# Patient Record
Sex: Female | Born: 1985 | Race: Black or African American | Hispanic: No | Marital: Single | Smoking: Former smoker
Health system: Southern US, Community
[De-identification: ages and names within clinical notes are randomized; demographics above are authoritative.]

## PROBLEM LIST (undated history)

## (undated) DIAGNOSIS — A749 Chlamydial infection, unspecified: Secondary | ICD-10-CM

## (undated) DIAGNOSIS — A599 Trichomoniasis, unspecified: Secondary | ICD-10-CM

## (undated) HISTORY — PX: NO PAST SURGERIES: SHX2092

---
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
## 2006-01-29 ENCOUNTER — Ambulatory Visit: Payer: Self-pay | Admitting: Sports Medicine

## 2006-02-06 ENCOUNTER — Ambulatory Visit: Payer: Self-pay | Admitting: Family Medicine

## 2006-02-14 ENCOUNTER — Ambulatory Visit: Payer: Self-pay | Admitting: Sports Medicine

## 2006-05-02 ENCOUNTER — Ambulatory Visit: Payer: Self-pay | Admitting: Family Medicine

## 2006-06-17 ENCOUNTER — Ambulatory Visit: Payer: Self-pay | Admitting: Sports Medicine

## 2006-07-18 ENCOUNTER — Ambulatory Visit: Payer: Self-pay | Admitting: Family Medicine

## 2006-07-18 DIAGNOSIS — K219 Gastro-esophageal reflux disease without esophagitis: Secondary | ICD-10-CM

## 2006-07-18 DIAGNOSIS — G43909 Migraine, unspecified, not intractable, without status migrainosus: Secondary | ICD-10-CM | POA: Insufficient documentation

## 2006-10-04 ENCOUNTER — Ambulatory Visit: Payer: Self-pay | Admitting: Family Medicine

## 2006-12-20 ENCOUNTER — Ambulatory Visit: Payer: Self-pay | Admitting: Family Medicine

## 2007-01-29 ENCOUNTER — Telehealth: Payer: Self-pay | Admitting: *Deleted

## 2007-01-30 ENCOUNTER — Encounter: Payer: Self-pay | Admitting: Family Medicine

## 2007-01-30 ENCOUNTER — Ambulatory Visit: Payer: Self-pay | Admitting: Family Medicine

## 2007-04-05 ENCOUNTER — Emergency Department (HOSPITAL_COMMUNITY): Admission: EM | Admit: 2007-04-05 | Discharge: 2007-04-05 | Payer: Self-pay | Admitting: Emergency Medicine

## 2007-05-03 ENCOUNTER — Emergency Department (HOSPITAL_COMMUNITY): Admission: EM | Admit: 2007-05-03 | Discharge: 2007-05-03 | Payer: Self-pay | Admitting: Emergency Medicine

## 2007-09-03 ENCOUNTER — Inpatient Hospital Stay (HOSPITAL_COMMUNITY): Admission: AD | Admit: 2007-09-03 | Discharge: 2007-09-03 | Payer: Self-pay | Admitting: Obstetrics and Gynecology

## 2007-09-15 ENCOUNTER — Observation Stay (HOSPITAL_COMMUNITY): Admission: AD | Admit: 2007-09-15 | Discharge: 2007-09-16 | Payer: Self-pay | Admitting: Obstetrics & Gynecology

## 2007-09-15 ENCOUNTER — Ambulatory Visit: Payer: Self-pay | Admitting: Obstetrics and Gynecology

## 2007-11-10 ENCOUNTER — Ambulatory Visit (HOSPITAL_COMMUNITY): Admission: RE | Admit: 2007-11-10 | Discharge: 2007-11-10 | Payer: Self-pay | Admitting: Family Medicine

## 2007-11-28 ENCOUNTER — Inpatient Hospital Stay (HOSPITAL_COMMUNITY): Admission: AD | Admit: 2007-11-28 | Discharge: 2007-11-28 | Payer: Self-pay | Admitting: Obstetrics & Gynecology

## 2007-11-28 ENCOUNTER — Ambulatory Visit: Payer: Self-pay | Admitting: Advanced Practice Midwife

## 2008-03-13 ENCOUNTER — Inpatient Hospital Stay (HOSPITAL_COMMUNITY): Admission: AD | Admit: 2008-03-13 | Discharge: 2008-03-13 | Payer: Self-pay | Admitting: Obstetrics & Gynecology

## 2008-03-25 ENCOUNTER — Ambulatory Visit: Payer: Self-pay | Admitting: Obstetrics & Gynecology

## 2008-03-25 ENCOUNTER — Inpatient Hospital Stay (HOSPITAL_COMMUNITY): Admission: AD | Admit: 2008-03-25 | Discharge: 2008-03-28 | Payer: Self-pay | Admitting: Obstetrics & Gynecology

## 2008-12-24 ENCOUNTER — Emergency Department (HOSPITAL_COMMUNITY): Admission: EM | Admit: 2008-12-24 | Discharge: 2008-12-24 | Payer: Self-pay | Admitting: Emergency Medicine

## 2009-05-23 ENCOUNTER — Encounter (INDEPENDENT_AMBULATORY_CARE_PROVIDER_SITE_OTHER): Payer: Self-pay | Admitting: *Deleted

## 2009-05-23 DIAGNOSIS — F17200 Nicotine dependence, unspecified, uncomplicated: Secondary | ICD-10-CM

## 2010-04-17 ENCOUNTER — Emergency Department (HOSPITAL_COMMUNITY): Admission: EM | Admit: 2010-04-17 | Discharge: 2010-04-17 | Payer: Self-pay | Admitting: Family Medicine

## 2010-05-01 ENCOUNTER — Emergency Department (HOSPITAL_COMMUNITY)
Admission: EM | Admit: 2010-05-01 | Discharge: 2010-05-01 | Payer: Self-pay | Source: Home / Self Care | Admitting: Emergency Medicine

## 2010-06-22 NOTE — Miscellaneous (Signed)
Summary: Tobacco Marcello Tuzzolino  Clinical Lists Changes  Problems: Added new problem of TOBACCO Kiaraliz Rafuse (ICD-305.1) 

## 2010-08-23 ENCOUNTER — Inpatient Hospital Stay (HOSPITAL_COMMUNITY)
Admission: AD | Admit: 2010-08-23 | Discharge: 2010-08-23 | Disposition: A | Discharge status: Home / Self Care | Payer: Medicaid Other | Source: Ambulatory Visit | Attending: Obstetrics & Gynecology | Admitting: Obstetrics & Gynecology

## 2010-08-23 DIAGNOSIS — N760 Acute vaginitis: Secondary | ICD-10-CM

## 2010-08-23 DIAGNOSIS — N926 Irregular menstruation, unspecified: Secondary | ICD-10-CM

## 2010-08-23 DIAGNOSIS — N939 Abnormal uterine and vaginal bleeding, unspecified: Secondary | ICD-10-CM

## 2010-08-23 DIAGNOSIS — N938 Other specified abnormal uterine and vaginal bleeding: Secondary | ICD-10-CM | POA: Insufficient documentation

## 2010-08-23 DIAGNOSIS — N72 Inflammatory disease of cervix uteri: Secondary | ICD-10-CM | POA: Insufficient documentation

## 2010-08-23 DIAGNOSIS — A499 Bacterial infection, unspecified: Secondary | ICD-10-CM | POA: Insufficient documentation

## 2010-08-23 DIAGNOSIS — B9689 Other specified bacterial agents as the cause of diseases classified elsewhere: Secondary | ICD-10-CM | POA: Insufficient documentation

## 2010-08-23 DIAGNOSIS — N949 Unspecified condition associated with female genital organs and menstrual cycle: Secondary | ICD-10-CM | POA: Insufficient documentation

## 2010-08-23 LAB — URINE MICROSCOPIC-ADD ON

## 2010-08-23 LAB — WET PREP, GENITAL
Trich, Wet Prep: NONE SEEN
Yeast Wet Prep HPF POC: NONE SEEN

## 2010-08-23 LAB — SAMPLE TO BLOOD BANK

## 2010-08-23 LAB — URINALYSIS, ROUTINE W REFLEX MICROSCOPIC
Ketones, ur: >80 mg/dL — AB
Leukocytes, UA: NEGATIVE
Specific Gravity, Urine: >1.03 — ABNORMAL HIGH (ref 1.005–1.030)
Urobilinogen, UA: 1 mg/dL (ref 0.0–1.0)
pH: 6 (ref 5.0–8.0)

## 2010-08-23 LAB — POCT PREGNANCY, URINE: Preg Test, Ur: NEGATIVE

## 2010-08-24 LAB — GC/CHLAMYDIA PROBE AMP, GENITAL: GC Probe Amp, Genital: NEGATIVE

## 2010-08-26 LAB — STREP A DNA PROBE

## 2010-08-26 LAB — RAPID STREP SCREEN (MED CTR MEBANE ONLY): Streptococcus, Group A Screen (Direct): NEGATIVE

## 2010-10-03 NOTE — Discharge Summary (Signed)
NAMENATONYA, Tammie Webster                ACCOUNT NO.:  [id number]   MEDICAL RECORD NO.:  [id number]          PATIENT TYPE:  OBV   LOCATION:  9302                          FACILITY:  [facility]   PHYSICIAN:  Lesly Dukes, M.D. DATE OF BIRTH:  [birth year]   DATE OF ADMISSION:  09/15/2007  DATE OF DISCHARGE:  09/16/2007                               DISCHARGE SUMMARY   HOSPITAL COURSE:  The patient is a 25 year old G1, P0 with hyperemesis  and hypokalemia.  Upon arrival to maternity admissions unit, the patient  was admitted for IV fluids and potassium runs.  The patient also  received antiemetics and did very well.  She has had no nausea or  vomiting for over 12 hours, and desires to go home.  The patient also  had 3 infections.  The patient had positive Chlamydia and was treated  with IV azithromycin.  The patient also has Trichomonas but we will hold  off treating her with Flagyl until her nausea and vomiting are well  controlled.  She also has a urinary tract infection and we will treat  her with Keflex for 7 days.  There is a urine culture pending.   DISCHARGE LABS:  Potassium on September 15, 2007, equals 3.4.   DISCHARGE INSTRUCTIONS:  [facility]. [facility]      will treat her for the Trichomonas.  2. Take all medications.   DISCHARGE MEDICATIONS:  1. Prenatal vitamins 1 tab p.o. daily.  2. Phenergan 25 mg p.o. q.6 h. p.r.n. nausea.  3. Keflex 500 mg 1 tab p.o. q.i.d. for 1 week.      Lesly Dukes, M.D.  Electronically Signed     [facility]/MEDQ  D:  09/16/2007  T:  09/16/2007  Job:  [id number]

## 2011-02-13 LAB — URINALYSIS, ROUTINE W REFLEX MICROSCOPIC
Bilirubin Urine: NEGATIVE
Glucose, UA: 100 — AB
Hgb urine dipstick: NEGATIVE
Hgb urine dipstick: NEGATIVE
Nitrite: POSITIVE — AB
Specific Gravity, Urine: 1.015
pH: 7

## 2011-02-13 LAB — POCT PREGNANCY, URINE: Preg Test, Ur: POSITIVE

## 2011-02-13 LAB — COMPREHENSIVE METABOLIC PANEL
ALT: 31
AST: 24
Albumin: 3.4 — ABNORMAL LOW
Calcium: 8.8
Creatinine, Ser: 0.79
GFR calc Af Amer: >60
Sodium: 129 — ABNORMAL LOW

## 2011-02-13 LAB — CBC
HCT: 38.1
HCT: 38.3
Hemoglobin: 13.4
MCHC: 35.6
Platelets: 274
RBC: 4.38
RDW: 12.5
WBC: 5.7
WBC: 6.6

## 2011-02-13 LAB — URINE CULTURE

## 2011-02-13 LAB — ABO/RH: ABO/RH(D): B POS

## 2011-02-13 LAB — POTASSIUM: Potassium: 3.4 — ABNORMAL LOW

## 2011-02-13 LAB — URINE MICROSCOPIC-ADD ON

## 2011-02-13 LAB — GC/CHLAMYDIA PROBE AMP, GENITAL
Chlamydia, DNA Probe: POSITIVE — AB
GC Probe Amp, Genital: NEGATIVE

## 2011-02-13 LAB — WET PREP, GENITAL

## 2011-02-15 LAB — URINALYSIS, ROUTINE W REFLEX MICROSCOPIC
Bilirubin Urine: NEGATIVE
Glucose, UA: NEGATIVE
Nitrite: NEGATIVE
Specific Gravity, Urine: 1.02
pH: 7.5

## 2011-02-15 LAB — URINE MICROSCOPIC-ADD ON

## 2011-02-19 LAB — URINALYSIS, ROUTINE W REFLEX MICROSCOPIC
Hgb urine dipstick: NEGATIVE
Protein, ur: 30 — AB
Urobilinogen, UA: 2 — ABNORMAL HIGH

## 2011-02-19 LAB — URINE CULTURE: Colony Count: >=100000

## 2011-02-19 LAB — URINE MICROSCOPIC-ADD ON

## 2011-02-20 LAB — CBC
Platelets: 365
RDW: 13.5

## 2011-02-27 LAB — POCT PREGNANCY, URINE
Operator id: 151321
Preg Test, Ur: NEGATIVE

## 2013-02-26 ENCOUNTER — Emergency Department (HOSPITAL_COMMUNITY)
Admission: EM | Admit: 2013-02-26 | Discharge: 2013-02-26 | Disposition: A | Discharge status: Home / Self Care | Payer: Medicaid Other | Attending: Emergency Medicine | Admitting: Emergency Medicine

## 2013-02-26 ENCOUNTER — Encounter (HOSPITAL_COMMUNITY): Payer: Self-pay | Admitting: Emergency Medicine

## 2013-02-26 DIAGNOSIS — L0291 Cutaneous abscess, unspecified: Secondary | ICD-10-CM

## 2013-02-26 DIAGNOSIS — L0231 Cutaneous abscess of buttock: Secondary | ICD-10-CM | POA: Insufficient documentation

## 2013-02-26 DIAGNOSIS — F17200 Nicotine dependence, unspecified, uncomplicated: Secondary | ICD-10-CM | POA: Insufficient documentation

## 2013-02-26 NOTE — ED Notes (Signed)
Pt has abscess to right upper inner buttocks x 2 weeks. Denies fever or chills. Minimal amount of drainage.

## 2013-02-26 NOTE — ED Provider Notes (Signed)
CSN: [id number]     Arrival date & time 02/26/13  0815 History   First MD Initiated Contact with Patient 02/26/13 [phone]     Chief Complaint  Patient presents with  . Abscess   (Consider location/radiation/quality/duration/timing/severity/associated sxs/prior Treatment) Patient is a 27 y.o. female presenting with abscess.  Abscess Location:  Ano-genital Ano-genital abscess location:  R buttock and gluteal cleft Size:  3cm Abscess quality: fluctuance, induration and painful   Abscess quality: no redness   Red streaking: no   Duration:  2 weeks Progression:  Worsening Pain details:    Quality:  Sharp   Severity:  Severe   Timing:  Constant   Progression:  Worsening Chronicity:  New Context: not diabetes and not immunosuppression   Relieved by:  Nothing Ineffective treatments:  Warm compresses and warm water soaks Associated symptoms: no fever, no headaches and no nausea   Associated symptoms comment:  No pain with BM.     History reviewed. No pertinent past medical history. History reviewed. No pertinent past surgical history. No family history on file. History  Substance Use Topics  . Smoking status: Current Some Day Smoker  . Smokeless tobacco: Not on file  . Alcohol Use: No   OB History   Grav Para Term Preterm Abortions TAB SAB Ect Mult Living                 Review of Systems  Constitutional: Negative for fever.  Gastrointestinal: Negative for nausea.  Neurological: Negative for headaches.  All other systems reviewed and are negative.    Allergies  Shellfish allergy  Home Medications  No current outpatient prescriptions on file. BP 116/78  Pulse 68  Temp(Src) 98.2 F (36.8 C) (Oral)  Resp 16  Ht 5\' 4"  (1.626 m)  Wt 129 lb (58.514 kg)  BMI 22.13 kg/m2  SpO2 100% Physical Exam  Nursing note and vitals reviewed. Constitutional: She is oriented to person, place, and time. She appears well-developed and well-nourished. No distress.  HENT:  Head:  Normocephalic and atraumatic.  Eyes: Conjunctivae are normal. No scleral icterus.  Neck: Neck supple.  Cardiovascular: Normal rate and intact distal pulses.   Pulmonary/Chest: Effort normal. No stridor. No respiratory distress.  Abdominal: Normal appearance. She exhibits no distension.  Neurological: She is alert and oriented to person, place, and time.  Skin: Skin is warm and dry. No rash noted.  Large indurated, fluctuant mass on right buttock just lateral to gluteal crease.  TTP.  Not draining.    Psychiatric: She has a normal mood and affect. Her behavior is normal.    ED Course  INCISION AND DRAINAGE Date/Time: 02/26/2013 9:43 AM Performed by: Blake Divine DAVID Authorized by: Blake Divine DAVID Consent: Verbal consent obtained. Risks and benefits: risks, benefits and alternatives were discussed Consent given by: patient Patient identity confirmed: verbally with patient Type: abscess Body area: anogenital (Right buttock at gluteal cleft) Local anesthetic: lidocaine 2% with epinephrine Anesthetic total: 6 ml Scalpel size: 11 Incision type: single straight Complexity: simple Drainage: purulent Drainage amount: copious Wound treatment: wound left open Packing material: 1/4 in iodoform gauze Patient tolerance: Patient tolerated the procedure well with no immediate complications.   (including critical care time) Labs Review Labs Reviewed - No data to display Imaging Review No results found.  MDM  No diagnosis found. 27 year old female with a right buttock abscess. Drained a large amount of purulent fluid.      Candyce Churn, MD 02/26/13 [phone]

## 2013-02-28 ENCOUNTER — Emergency Department (HOSPITAL_COMMUNITY)
Admission: EM | Admit: 2013-02-28 | Discharge: 2013-02-28 | Disposition: A | Discharge status: Home / Self Care | Payer: Medicaid Other | Attending: Emergency Medicine | Admitting: Emergency Medicine

## 2013-02-28 ENCOUNTER — Encounter (HOSPITAL_COMMUNITY): Payer: Self-pay | Admitting: Emergency Medicine

## 2013-02-28 DIAGNOSIS — F17200 Nicotine dependence, unspecified, uncomplicated: Secondary | ICD-10-CM | POA: Insufficient documentation

## 2013-02-28 DIAGNOSIS — Z5189 Encounter for other specified aftercare: Secondary | ICD-10-CM

## 2013-02-28 DIAGNOSIS — Z4801 Encounter for change or removal of surgical wound dressing: Secondary | ICD-10-CM | POA: Insufficient documentation

## 2013-02-28 NOTE — ED Notes (Signed)
Seen in ED with abscess and had I&D with packing.  Here for wound check.  Denies pain and states has been draining intermittently light pink yellow.

## 2013-02-28 NOTE — ED Provider Notes (Signed)
CSN: [id number]     Arrival date & time 02/28/13  1108 History  This chart was scribed for non-physician practitioner, Arthor Captain, working with No att. providers found by Clydene Laming, ED Scribe. This patient was seen in room TR09C/TR09C and the patient's care was started at 11:22 AM.   Chief Complaint  Patient presents with  . Wound Check    The history is provided by the patient. No language interpreter was used.   HPI Comments: Tammie Webster is a 27 y.o. female who presents to the Emergency Department requesting a wound check from an abscess which she was seen and treated for two days ago. Abscess was drained and packed. She states she has a doctor appointment on October 15 th. Pt denies feelings of emesis.  No past medical history on file. No past surgical history on file. No family history on file. History  Substance Use Topics  . Smoking status: Current Some Day Smoker  . Smokeless tobacco: Not on file  . Alcohol Use: No   OB History   Grav Para Term Preterm Abortions TAB SAB Ect Mult Living                 Review of Systems  Constitutional: Negative for fever and chills.  Gastrointestinal: Negative for vomiting.  Skin: Positive for wound (Abscess).    Allergies  Shellfish allergy  Home Medications  No current outpatient prescriptions on file. Triage Vitals:BP 116/78  Pulse 94  Temp(Src) 97.5 F (36.4 C) (Oral)  Resp 16  SpO2 100% Physical Exam  Nursing note and vitals reviewed. Constitutional: She is oriented to person, place, and time. She appears well-developed and well-nourished. No distress.  HENT:  Head: Normocephalic and atraumatic.  Eyes: EOM are normal.  Neck: Neck supple. No tracheal deviation present.  Cardiovascular: Normal rate.   Pulmonary/Chest: Effort normal. No respiratory distress.  Musculoskeletal: Normal range of motion.  Neurological: She is alert and oriented to person, place, and time.  Skin: Skin is warm and dry.  Well healing  abscess statues post ind 3 cm surround induration  Redness appears to be resolving Discharge noticed with packing removal   Psychiatric: She has a normal mood and affect. Her behavior is normal.    ED Course  Procedures (including critical care time) DIAGNOSTIC STUDIES: Oxygen Saturation is 100% on RA, normal by my interpretation.    COORDINATION OF CARE: 11:32 AM- Discussed treatment plan with pt at bedside. Pt verbalized understanding and agreement with plan.   Labs Review Labs Reviewed - No data to display Imaging Review No results found.  EKG Interpretation   None       MDM   1. Wound check, abscess    Pt abscess packing appeals to be healing well. Pt will be discharged with safety precautions.  I personally performed the services described in this documentation, which was scribed in my presence. The recorded information has been reviewed and is accurate.      Arthor Captain, PA-C 02/28/13 1751

## 2013-03-01 NOTE — ED Provider Notes (Signed)
Medical screening examination/treatment/procedure(s) were performed by non-physician practitioner and as supervising physician I was immediately available for consultation/collaboration.   Carmine Youngberg, MD 03/01/13 0932 

## 2013-06-14 ENCOUNTER — Encounter (HOSPITAL_COMMUNITY): Payer: Self-pay | Admitting: Emergency Medicine

## 2013-06-14 ENCOUNTER — Emergency Department (HOSPITAL_COMMUNITY)
Admission: EM | Admit: 2013-06-14 | Discharge: 2013-06-14 | Disposition: A | Discharge status: Home / Self Care | Payer: Medicaid Other | Source: Home / Self Care

## 2013-06-14 DIAGNOSIS — I10 Essential (primary) hypertension: Secondary | ICD-10-CM

## 2013-06-14 DIAGNOSIS — T887XXA Unspecified adverse effect of drug or medicament, initial encounter: Secondary | ICD-10-CM

## 2013-06-14 DIAGNOSIS — R42 Dizziness and giddiness: Secondary | ICD-10-CM

## 2013-06-14 HISTORY — DX: Chlamydial infection, unspecified: A74.9

## 2013-06-14 HISTORY — DX: Trichomoniasis, unspecified: A59.9

## 2013-06-14 LAB — POCT URINALYSIS DIP (DEVICE)
BILIRUBIN URINE: NEGATIVE
GLUCOSE, UA: NEGATIVE mg/dL
HGB URINE DIPSTICK: NEGATIVE
Leukocytes, UA: NEGATIVE
NITRITE: NEGATIVE
PH: 6 (ref 5.0–8.0)
Protein, ur: NEGATIVE mg/dL
Urobilinogen, UA: 1 mg/dL (ref 0.0–1.0)

## 2013-06-14 LAB — POCT PREGNANCY, URINE: Preg Test, Ur: NEGATIVE

## 2013-06-14 NOTE — ED Notes (Signed)
States went to health dept for routine exam and deposhot - was told 1/22 that she tested positive for trich and chlamydia; completed abx treatment for both.  C/O dizziness and low abd pain "side effects" ever since starting first dose.  States vaginal discharge now gone.  Denies fevers.  Denies vomiting; nausea has resolved (after 1st dose).  Admits she does not have adequate PO fluid intake.

## 2013-06-14 NOTE — Discharge Instructions (Signed)
Dizziness Dizziness is a common problem. It is a feeling of unsteadiness or lightheadedness. You may feel like you are about to faint. Dizziness can lead to injury if you stumble or fall. A person of any age group can suffer from dizziness, but dizziness is more common in older adults. CAUSES  Dizziness can be caused by many different things, including:  Middle ear problems.  Standing for too long.  Infections.  An allergic reaction.  Aging.  An emotional response to something, such as the sight of blood.  Side effects of medicines.  Fatigue.  Problems with circulation or blood pressure.  Excess use of alcohol, medicines, or illegal drug use.  Breathing too fast (hyperventilation).  An arrhythmia or problems with your heart rhythm.  Low red blood cell count (anemia).  Pregnancy.  Vomiting, diarrhea, fever, or other illnesses that cause dehydration.  Diseases or conditions such as Parkinson's disease, high blood pressure (hypertension), diabetes, and thyroid problems.  Exposure to extreme heat. DIAGNOSIS  To find the cause of your dizziness, your caregiver may do a physical exam, lab tests, radiologic imaging scans, or an electrocardiography test (ECG).  TREATMENT  Treatment of dizziness depends on the cause of your symptoms and can vary greatly. HOME CARE INSTRUCTIONS   Drink enough fluids to keep your urine clear or pale yellow. This is especially important in very hot weather. In the elderly, it is also important in cold weather.  If your dizziness is caused by medicines, take them exactly as directed. When taking blood pressure medicines, it is especially important to get up slowly.  Rise slowly from chairs and steady yourself until you feel okay.  In the morning, first sit up on the side of the bed. When this seems okay, stand slowly while holding onto something until you know your balance is fine.  If you need to stand in one place for a long time, be sure to  move your legs often. Tighten and relax the muscles in your legs while standing.  If dizziness continues to be a problem, have someone stay with you for a day or two. Do this until you feel you are well enough to stay alone. Have the person call your caregiver if he or she notices changes in you that are concerning.  Do not drive or use heavy machinery if you feel dizzy.  Do not drink alcohol. SEEK IMMEDIATE MEDICAL CARE IF:   Your dizziness or lightheadedness gets worse.  You feel nauseous or vomit.  You develop problems with talking, walking, weakness, or using your arms, hands, or legs.  You are not thinking clearly or you have difficulty forming sentences. It may take a friend or family member to determine if your thinking is normal.  You develop chest pain, abdominal pain, shortness of breath, or sweating.  Your vision changes.  You notice any bleeding.  You have side effects from medicine that seems to be getting worse rather than better. MAKE SURE YOU:   Understand these instructions.  Will watch your condition.  Will get help right away if you are not doing well or get worse. Document Released: 10/31/2000 Document Revised: 07/30/2011 Document Reviewed: 11/24/2010 [facility] Patient Information 2014 [place], [place].  Near-Syncope Near-syncope (commonly known as near fainting) is sudden weakness, dizziness, or feeling like you might pass out. This can happen when getting up or while standing for a long time. It is caused by a sudden decrease in blood flow to the brain, which can occur for various  reasons. Most of the reasons are not serious.  HOME CARE Watch your condition for any changes.  Have someone stay with you until you feel stable.  If you feel like you are going to pass out:  Lie down right away.  Breathe deeply and steadily.  Move only when the feeling has gone away. Most of the time, this feeling lasts only a few minutes. You may feel tired for several  hours.  Drink enough fluids to keep your pee (urine) clear or pale yellow.  If you are taking blood pressure or heart medicine, stand up slowly.  Follow up with your doctor as told. GET HELP RIGHT AWAY IF:   You have a severe headache.  You have unusual pain in the chest, belly (abdomen), or back.  You have bleeding from the mouth or butt (rectum), or you have black or tarry poop (stool).  You feel your heart beat differently than normal, or you have a very fast pulse.  You pass out, or you twitch and shake when you pass out.  You pass out when sitting or lying down.  You feel confused.  You have trouble walking.  You are weak.  You have vision problems. MAKE SURE YOU:   Understand these instructions.  Will watch your condition.  Will get help right away if you are not doing well or get worse. Document Released: 10/24/2007 Document Revised: 01/07/2013 Document Reviewed: 10/10/2012 [facility] Patient Information 2014 [place].

## 2013-06-14 NOTE — ED Provider Notes (Signed)
CSN: [id number]     Arrival date & time 06/14/13  1540 History   First MD Initiated Contact with Patient 06/14/13 1721     Chief Complaint  Patient presents with  . Dizziness  . Abdominal Pain   (Consider location/radiation/quality/duration/timing/severity/associated sxs/prior Treatment) HPI Comments: After taking her medications today for STD's she developed dizziness and discomfort in abdomen. No actual pain. No nausea. St has bee afraid of her reaction to the meds and almost passed out. Sx's are subsiding now.   Past Medical History  Diagnosis Date  . Trichomonas infection   . Chlamydia    History reviewed. No pertinent past surgical history. No family history on file. History  Substance Use Topics  . Smoking status: Current Some Day Smoker  . Smokeless tobacco: Not on file  . Alcohol Use: No   OB History   Grav Para Term Preterm Abortions TAB SAB Ect Mult Living                 Review of Systems  Constitutional: Positive for activity change and appetite change. Negative for fever.  HENT: Negative.   Respiratory: Negative.   Cardiovascular: Negative.   Gastrointestinal: Negative for nausea, vomiting, abdominal pain and abdominal distention.  Skin: Negative.   Neurological: Positive for dizziness.    Allergies  Shellfish allergy  Home Medications   Current Outpatient Rx  Name  Route  Sig  Dispense  Refill  . medroxyPROGESTERone (DEPO-PROVERA) 150 MG/ML injection   Intramuscular   Inject 150 mg into the muscle every 3 (three) months.          BP 115/80  Pulse 128  Temp(Src) 98.8 F (37.1 C) (Oral)  Resp 19  SpO2 97%  LMP 05/30/2013 Physical Exam  Nursing note and vitals reviewed. Constitutional: She is oriented to person, place, and time. She appears well-developed and well-nourished. No distress.  Eyes: Conjunctivae and EOM are normal.  Neck: Normal range of motion. Neck supple.  Cardiovascular: Normal rate, regular rhythm and normal heart sounds.    Pulmonary/Chest: Effort normal and breath sounds normal. No respiratory distress. She has no wheezes. She has no rales.  Abdominal: Soft. Bowel sounds are normal. She exhibits no distension. There is no tenderness. There is no rebound.  Neurological: She is alert and oriented to person, place, and time. She exhibits normal muscle tone.  Skin: Skin is warm and dry.  Psychiatric: She has a normal mood and affect.    ED Course  Procedures (including critical care time) Labs Review Labs Reviewed  POCT URINALYSIS DIP (DEVICE) - Abnormal; Notable for the following:    Ketones, ur TRACE (*)    All other components within normal limits  POCT PREGNANCY, URINE   Imaging Review No results found.  Results for orders placed during the hospital encounter of 06/14/13  POCT URINALYSIS DIP (DEVICE)      Result Value Range   Glucose, UA NEGATIVE  NEGATIVE mg/dL   Bilirubin Urine NEGATIVE  NEGATIVE   Ketones, ur TRACE (*) NEGATIVE mg/dL   Specific Gravity, Urine >=1.030  1.005 - 1.030   Hgb urine dipstick NEGATIVE  NEGATIVE   pH 6.0  5.0 - 8.0   Protein, ur NEGATIVE  NEGATIVE mg/dL   Urobilinogen, UA 1.0  0.0 - 1.0 mg/dL   Nitrite NEGATIVE  NEGATIVE   Leukocytes, UA NEGATIVE  NEGATIVE  POCT PREGNANCY, URINE      Result Value Range   Preg Test, Ur NEGATIVE  NEGATIVE  MDM   1. Drug side effects   2. Dizziness      Reassurance, drink plenty of fluids and stay well hydrated. OOW for today  Hayden Rasmussen, NP 06/14/13 1742  Hayden Rasmussen, NP 06/14/13 [phone]

## 2013-06-15 NOTE — ED Provider Notes (Signed)
Medical screening examination/treatment/procedure(s) were performed by resident physician or non-physician practitioner and as supervising physician I was immediately available for consultation/collaboration.   Nohelia Valenza DOUGLAS MD.   Jacquis Paxton D Ezell Poke, MD 06/15/13 1434 

## 2013-06-27 ENCOUNTER — Encounter (HOSPITAL_COMMUNITY): Payer: Self-pay | Admitting: Emergency Medicine

## 2013-06-27 ENCOUNTER — Emergency Department (HOSPITAL_COMMUNITY)
Admission: EM | Admit: 2013-06-27 | Discharge: 2013-06-27 | Disposition: A | Discharge status: Home / Self Care | Payer: Medicaid Other | Source: Home / Self Care | Attending: Family Medicine | Admitting: Family Medicine

## 2013-06-27 DIAGNOSIS — J111 Influenza due to unidentified influenza virus with other respiratory manifestations: Secondary | ICD-10-CM

## 2013-06-27 MED ORDER — OSELTAMIVIR PHOSPHATE 75 MG PO CAPS: 75.0000 mg | ORAL_CAPSULE | Freq: Two times a day (BID) | ORAL | Status: DC

## 2013-06-27 MED ORDER — ACETAMINOPHEN 325 MG PO TABS
650.0000 mg | ORAL_TABLET | Freq: Once | ORAL | Status: AC
  Administered 2013-06-27: 650 mg via ORAL

## 2013-06-27 MED ORDER — ACETAMINOPHEN 325 MG PO TABS
ORAL_TABLET | ORAL | Status: AC
  Filled 2013-06-27: qty 2

## 2013-06-27 NOTE — Discharge Instructions (Signed)
Thank you for coming in today. Take Tamiflu twice daily for 5 days Take Tylenol, Aleve, or ibuprofen for pain fevers and chills and body aches. Return to work when feeling better. Call or go to the emergency room if you get worse, have trouble breathing, have chest pains, or palpitations.   Influenza A (H1N1) H1N1 formerly called "swine flu" is a new influenza virus causing sickness in people. The H1N1 virus is different from seasonal influenza viruses. However, the H1N1 symptoms are similar to seasonal influenza and it is spread from person to person. You may be at higher risk for serious problems if you have underlying serious medical conditions. The CDC and the [employer] CompanyWorld Health Organization are following reported cases around the world. CAUSES   The flu is thought to spread mainly person-to-person through coughing or sneezing of infected people.  A person may become infected by touching something with the virus on it and then touching their mouth or nose. SYMPTOMS   Fever.  Headache.  Tiredness.  Cough.  Sore throat.  Runny or stuffy nose.  Body aches.  Diarrhea and vomiting These symptoms are referred to as "flu-like symptoms." A lot of different illnesses, including the common cold, may have similar symptoms. DIAGNOSIS   There are tests that can tell if you have the H1N1 virus.  Confirmed cases of H1N1 will be reported to the state or local health department.  A doctor's exam may be needed to tell whether you have an infection that is a complication of the flu. HOME CARE INSTRUCTIONS   Stay informed. Visit the [facility] website for current recommendations. Visit [employer]tnwww.cdc.gov/H1N1flu/. You may also call 1-800-CDC-INFO (636-824-92711-434-714-8758).  Get help early if you develop any of the above symptoms.  If you are at high risk from complications of the flu, talk to your caregiver as soon as you develop flu-like symptoms. Those at higher risk for complications include:  People 65 years or  older.  People with chronic medical conditions.  Pregnant women.  Young children.  Your caregiver may recommend antiviral medicine to help treat the flu.  If you get the flu, get plenty of rest, drink enough water and fluids to keep your urine clear or pale yellow, and avoid using alcohol or tobacco.  You may take over-the-counter medicine to relieve the symptoms of the flu if your caregiver approves. (Never give aspirin to children or teenagers who have flu-like symptoms, particularly fever). TREATMENT  If you do get sick, antiviral drugs are available. These drugs can make your illness milder and make you feel better faster. Treatment should start soon after illness starts. It is only effective if taken within the first day of becoming ill. Only your caregiver can prescribe antiviral medication.  PREVENTION   Cover your nose and mouth with a tissue or your arm when you cough or sneeze. Throw the tissue away.  Wash your hands often with soap and warm water, especially after you cough or sneeze. Alcohol-based cleaners are also effective against germs.  Avoid touching your eyes, nose or mouth. This is one way germs spread.  Try to avoid contact with sick people. Follow public health advice regarding school closures. Avoid crowds.  Stay home if you get sick. Limit contact with others to keep from infecting them. People infected with the H1N1 virus may be able to infect others anywhere from 1 day before feeling sick to 5-7 days after getting flu symptoms.  An H1N1 vaccine is available to help protect against the virus. In  addition to the H1N1 vaccine, you will need to be vaccinated for seasonal influenza. The H1N1 and seasonal vaccines may be given on the same day. The CDC especially recommends the H1N1 vaccine for:  Pregnant women.  People who live with or care for children younger than 22 months of age.  Health care and emergency services personnel.  Persons between the ages of 52  months through 61 years of age.  People from ages 77 through 56 years who are at higher risk for H1N1 because of chronic health disorders or immune system problems. FACEMASKS In community and home settings, the use of facemasks and N95 respirators are not normally recommended. In certain circumstances, a facemask or N95 respirator may be used for persons at increased risk of severe illness from influenza. Your caregiver can give additional recommendations for facemask use. IN CHILDREN, EMERGENCY WARNING SIGNS THAT NEED URGENT MEDICAL CARE:  Fast breathing or trouble breathing.  Bluish skin color.  Not drinking enough fluids.  Not waking up or not interacting normally.  Being so fussy that the child does not want to be held.  Your child has an oral temperature above 102 F (38.9 C), not controlled by medicine.  Your baby is older than 3 months with a rectal temperature of 102 F (38.9 C) or higher.  Your baby is 37 months old or younger with a rectal temperature of 100.4 F (38 C) or higher.  Flu-like symptoms improve but then return with fever and worse cough. IN ADULTS, EMERGENCY WARNING SIGNS THAT NEED URGENT MEDICAL CARE:  Difficulty breathing or shortness of breath.  Pain or pressure in the chest or abdomen.  Sudden dizziness.  Confusion.  Severe or persistent vomiting.  Bluish color.  You have a oral temperature above 102 F (38.9 C), not controlled by medicine.  Flu-like symptoms improve but return with fever and worse cough. SEEK IMMEDIATE MEDICAL CARE IF:  You or someone you know is experiencing any of the above symptoms. When you arrive at the emergency center, report that you think you have the flu. You may be asked to wear a mask and/or sit in a secluded area to protect others from getting sick. MAKE SURE YOU:   Understand these instructions.  Will watch your condition.  Will get help right away if you are not doing well or get worse. Some of this  information courtesy of the CDC.  Document Released: 10/24/2007 Document Revised: 07/30/2011 Document Reviewed: 10/24/2007 [facility] Patient Information 2014 [place], [place].

## 2013-06-27 NOTE — ED Provider Notes (Signed)
Tammie Webster is a 28 y.o. female who presents to Urgent Care today for fever body ache headache and chills. No nausea vomiting diarrhea or shortness of breath. Patient's son was recently diagnosed with influenza. The symptoms started today and her severe. She has not tried any medications yet.   Past Medical History  Diagnosis Date  . Trichomonas infection   . Chlamydia    History  Substance Use Topics  . Smoking status: Current Some Day Smoker  . Smokeless tobacco: Not on file  . Alcohol Use: No   ROS as above Medications: No current facility-administered medications for this encounter.   Current Outpatient Prescriptions  Medication Sig Dispense Refill  . medroxyPROGESTERone (DEPO-PROVERA) 150 MG/ML injection Inject 150 mg into the muscle every 3 (three) months.      Marland Kitchen. oseltamivir (TAMIFLU) 75 MG capsule Take 1 capsule (75 mg total) by mouth every 12 (twelve) hours.  10 capsule  0    Exam:  BP 114/73  Pulse 117  Temp(Src) 103.1 F (39.5 C) (Oral)  Resp 18  SpO2 98%  LMP 06/27/2013 Gen: Well NAD nontoxic HEENT: EOMI,  MMM Lungs: Normal work of breathing. CTABL Heart: Tachycardia but regular no MRG Abd: NABS, Soft. NT, ND Exts: Brisk capillary refill, warm and well perfused.   Assessment and Plan: 28 y.o. female with influenza-like illness. Plan for treatment with Tamiflu and NSAIDs and Tylenol. Followup as needed.  Discussed warning signs or symptoms. Please see discharge instructions. Patient expresses understanding.    Rodolph BongEvan S Jacen Carlini, MD 06/27/13 [phone]

## 2013-06-27 NOTE — ED Notes (Signed)
C/o cold/flu like sxs since yest Sxs include fevers, dizziness, chest pain due to dry cough, chills Denies n/v/d, SOB, wheezing Had ibup at 1430 and taking hot tea Alert w/no signs of acute distress.

## 2014-04-08 ENCOUNTER — Emergency Department (HOSPITAL_COMMUNITY)
Admission: EM | Admit: 2014-04-08 | Discharge: 2014-04-08 | Disposition: A | Discharge status: Home / Self Care | Payer: Medicaid Other | Attending: Emergency Medicine | Admitting: Emergency Medicine

## 2014-04-08 ENCOUNTER — Encounter (HOSPITAL_COMMUNITY): Payer: Self-pay | Admitting: Emergency Medicine

## 2014-04-08 DIAGNOSIS — Z79899 Other long term (current) drug therapy: Secondary | ICD-10-CM | POA: Diagnosis not present

## 2014-04-08 DIAGNOSIS — L0291 Cutaneous abscess, unspecified: Secondary | ICD-10-CM

## 2014-04-08 DIAGNOSIS — Z8619 Personal history of other infectious and parasitic diseases: Secondary | ICD-10-CM | POA: Diagnosis not present

## 2014-04-08 DIAGNOSIS — Z720 Tobacco use: Secondary | ICD-10-CM | POA: Insufficient documentation

## 2014-04-08 DIAGNOSIS — L0231 Cutaneous abscess of buttock: Secondary | ICD-10-CM | POA: Diagnosis not present

## 2014-04-08 MED ORDER — HYDROCODONE-ACETAMINOPHEN 5-325 MG PO TABS: ORAL_TABLET | ORAL | Status: DC

## 2014-04-08 MED ORDER — SULFAMETHOXAZOLE-TRIMETHOPRIM 800-160 MG PO TABS: 1.0000 | ORAL_TABLET | Freq: Two times a day (BID) | ORAL | Status: DC

## 2014-04-08 MED ORDER — LIDOCAINE-EPINEPHRINE (PF) 2 %-1:200000 IJ SOLN
10.0000 mL | Freq: Once | INTRAMUSCULAR | Status: AC
  Administered 2014-04-08: 10 mL via INTRADERMAL
  Filled 2014-04-08: qty 20

## 2014-04-08 MED ORDER — LIDOCAINE-EPINEPHRINE (PF) 1 %-1:200000 IJ SOLN: 20.0000 mL | Freq: Once | INTRAMUSCULAR | Status: DC

## 2014-04-08 NOTE — ED Notes (Signed)
Patient states has had abscess on R buttock.   Patient states she has been taking epsom salt baths, and been taking percocet for pain.   Patient states she had one in the same place a few months ago.   Patient states "it's ready to be cut open".

## 2014-04-08 NOTE — Discharge Instructions (Signed)
Wash gently with soap and water every 12 hours, rinse the affected area with warm water thoroughly every 12 hours. Push the skin around the area to make sure all the water is pushed out after you are through rinsing.   If you develop fever, have vomiting or if the swelling and redness starts spreading , return to the emergency room immediately for a recheck.  Take vicodin for breakthrough pain, do not drink alcohol, drive, care for children or do other critical tasks while taking vicodin.  Do not hesitate to return to the emergency room for any new, worsening or concerning symptoms.  Please obtain primary care using resource guide below. But the minute you were seen in the emergency room and that they will need to obtain records for further outpatient management.   Abscess Care After An abscess (also called a boil or furuncle) is an infected area that contains a collection of pus. Signs and symptoms of an abscess include pain, tenderness, redness, or hardness, or you may feel a moveable soft area under your skin. An abscess can occur anywhere in the body. The infection may spread to surrounding tissues causing cellulitis. A cut (incision) by the surgeon was made over your abscess and the pus was drained out. Gauze may have been packed into the space to provide a drain that will allow the cavity to heal from the inside outwards. The boil may be painful for 5 to 7 days. Most people with a boil do not have high fevers. Your abscess, if seen early, may not have localized, and may not have been lanced. If not, another appointment may be required for this if it does not get better on its own or with medications. HOME CARE INSTRUCTIONS   Only take over-the-counter or prescription medicines for pain, discomfort, or fever as directed by your caregiver.  When you bathe, soak and then remove gauze or iodoform packs at least daily or as directed by your caregiver. You may then wash the wound gently with mild  soapy water. Repack with gauze or do as your caregiver directs. SEEK IMMEDIATE MEDICAL CARE IF:   You develop increased pain, swelling, redness, drainage, or bleeding in the wound site.  You develop signs of generalized infection including muscle aches, chills, fever, or a general ill feeling.  An oral temperature above 102 F (38.9 C) develops, not controlled by medication. See your caregiver for a recheck if you develop any of the symptoms described above. If medications (antibiotics) were prescribed, take them as directed. Document Released: 11/23/2004 Document Revised: 07/30/2011 Document Reviewed: 07/21/2007 [facility] Patient Information 2015 [place], [place]. This information is not intended to replace advice given to you by your health care provider. Make sure you discuss any questions you have with your health care provider.    Emergency Department Resource Guide 1) Find a Doctor and Pay Out of Pocket Although you won't have to find out who is covered by your insurance plan, it is a good idea to ask around and get recommendations. You will then need to call the office and see if the doctor you have chosen will accept you as a new patient and what types of options they offer for patients who are self-pay. Some doctors offer discounts or will set up payment plans for their patients who do not have insurance, but you will need to ask so you aren't surprised when you get to your appointment.  2) Contact Your Local Health Department Not all health departments have doctors  that can see patients for sick visits, but many do, so it is worth a call to see if yours does. If you don't know where your local health department is, you can check in your phone book. The CDC also has a tool to help you locate your state's health department, and many state websites also have listings of all of their local health departments.  3) Find a Walk-in Clinic If your illness is not likely to be very severe or  complicated, you may want to try a walk in clinic. These are popping up all over the country in pharmacies, drugstores, and shopping centers. They're usually staffed by nurse practitioners or physician assistants that have been trained to treat common illnesses and complaints. They're usually fairly quick and inexpensive. However, if you have serious medical issues or chronic medical problems, these are probably not your best option.  No Primary Care Doctor: - Call Health Connect at  [phone] - they can help you locate a primary care doctor that  accepts your insurance, provides certain services, etc. - Physician Referral Service- [phone]  Chronic Pain Problems: Organization         Address  Phone   Notes  [facility]  [phone] Patients need to be referred by their primary care doctor.   Medication Assistance: Organization         Address  Phone   Notes  [facility] Medication [facility] [address] [place], [place] [address] [phone] --Must be a resident of [facility] [place] -- Must have NO insurance coverage whatsoever (no Medicaid/ Medicare, etc.) -- The pt. MUST have a primary care doctor that directs their care regularly and follows them in the community   MedAssist  [phone]   [employer]  [phone]    Agencies that provide inexpensive medical care: Organization         Address  Phone   Notes  [facility]  [phone]   [facility]    [phone]) [phone]   [facility] [address] [place], [place] [address] [phone]) [phone]   [facility] [address], [place] [phone]) [phone]   Planned Parenthood    [phone](336) [phone]   [facility]    [phone]   [facility]  [address], [place] Phone:  [phone], Fax:  [phone] Hours of Operation:  9 am - 6 pm, M-F.  Also accepts  Medicaid/Medicare and self-pay.  [facility]  [address], [place] Phone: [phone]) [phone], Fax: [phone]. Hours of Operation:  8:30 am - 5:30 pm, M-F.  Also accepts Medicaid and self-pay.  [facility] [place] [address], [place] Phone: [phone](336) [phone]   [facility] [address], [place], [place] [phone], Ext. [phone] Mondays & Thursdays: 7-9 AM.  First 15 patients are seen on a first come, first serve basis.    Medicaid-accepting [facility] Providers:  Organization         Address  Phone   Notes  [facility] [address], [place] [phone]) [phone] Also accepts self-pay patients.  [facility] [address], [place]  [phone]) [phone]   [facility] [address], [place] [phone]   [facility] [address], [place] [phone]   Renaye Rakers [address], [place]   [phone] Only accepts [place] patients after they have their name applied to their card.   Self-Pay (no insurance) in [facility]:  Organization         Address  Phone   Notes  Sickle Cell Patients, [facility] [address] [place], [place] [phone]   [facility] [address] [place], [place] [phone]   [facility] [place]  [address], [place] [phone]   [facility]/Dr. Osei-Bonsu  [address] [place] or [address], [place] [phone] Phone number for both [place] and [place] locations is the same.  Urgent Medical and [facility] [address], [place] [phone]   [facility] [address], [place] or [address] [phone]   [facility] [address] [place] [phone], phone; [phone], fax Sees patients 1st and 3rd Saturday of every month.  Must not qualify for public or private insurance (i.e. Medicaid, Medicare, [place] Health Choice, Veterans' Benefits)  Household income should be no more than 200% of the poverty level The clinic cannot treat you if you are pregnant or think you are pregnant  Sexually transmitted diseases are not treated at the clinic.    Dental Care: Organization         Address  Phone  Notes  [facility] of [facility] [address] [place], [place] [phone] Accepts children up to age 15 who are enrolled in [place] or [place] Health Choice; pregnant women with a Medicaid card; and children who have applied for Medicaid or [place] Health Choice, but were declined, whose parents can pay a reduced fee at time of service.  [facility] Department of [facility]  [address], [place] [phone] Accepts children up to age 60 who are enrolled in [place] or [place] Health Choice; pregnant women with a Medicaid card; and children who have applied for Medicaid or [place] Health Choice, but were declined, whose parents can pay a reduced fee at time of service.  [facility]  [address] [place], [place] [phone] Patients are seen by appointment only. Walk-ins are not accepted. [facility] will see patients 77 years of age and older. Monday - Tuesday (8am-5pm) Most Wednesdays (8:30-5pm) $30 per visit, cash only  [facility] Access PRO[facility]  [address], [facility] [phone] Patients are seen by appointment only. Walk-ins are not accepted. [facility] will see patients 24 years of age and older. One Wednesday Evening (Monthly: Volunteer Based).  $30 per visit, cash only  [employer]  [phone] for adults; Children under age 54, call Graduate Pediatric Dentistry at [phone]. Children aged  79-14, please call [phone] to request a pediatric application.  Dental services are provided in all areas of dental care including fillings, crowns and bridges, complete and partial dentures, implants, gum treatment, root canals, and extractions. Preventive care is also provided. Treatment is provided to both adults and children. Patients are selected via a lottery and there is often a waiting list.   [facility] [address], [place]  [phone] [email or web address]   [facility] [address], [place], [place] [phone], Ext. [phone] Second and Fourth Thursday of each month, opens at 6:30 AM; Clinic ends at 9 AM.  Patients are seen on a first-come first-served basis, and a limited number are seen during each clinic.   [facility]  [address], [place], [place] [phone]) [phone]   Eligibility Requirements You must have lived in [place], [place], or [place] counties for at least the last three months.   You cannot be eligible for state or federal sponsored National Cityhealthcare insurance, including CIGNAVeterans Administration, [place]icaid, or [employer]Medicare.   You generally cannot be eligible for healthcare insurance through your employer.    How to apply: Eligibility screenings are held every Tuesday and Wednesday afternoon from 1:00 pm until 4:00 pm. You do not need an appointment for the interview!  [facility] [address], [place], [place] [phone]   [facility]  [phone]   [facility]  [phone]   [facility]  [phone]    Behavioral Health Resources in the Community: Intensive Outpatient Programs Organization         Address  Phone  Notes  [facility] [address], [place], [place] [phone]   [facility] [address], [place], [place] [phone]   [facility] [address],  [place], [place]  [phone]   [facility] [address],  [place], [place] [phone] or [phone]   Substance Abuse Resources Organization         Address  Phone  Notes  [facility] Drug Services  [phone]   [facility]  [phone]   [facility]  [phone]   [facility]  [phone]   Residential & Outpatient Substance Abuse Program  [phone]   Psychological Services Organization         Address  Phone  Notes  [facility]  [phone]- [phone]   [facility]  [phone]- [phone]   [facility] [address], [place] [phone] or [phone]    Mobile Crisis Teams Organization         Address  Phone  Notes  [facility], Mobile Crisis Care Unit  [phone]   [facility]  [address] [place], [place] [phone]   Doristine LocksSharon DeEsch [address] [place] [phone]    Self-Help/Support Groups Organization         Address  Phone             Notes  [facility] [place] - variety of support groups  [phone] Call for more information  Narcotics Anonymous (NA), [facility]aring [facility] [address], [place]  2 meetings at this location   Statisticianesidential Treatment Programs Organization         Address  Phone  Notes  [facility] [address],    [place]  [phone]   [facility]  [address], [place] [phone], [place], [place] [phone]   [facility] [address], [place] [phone] Admissions: 8am-3pm M-F  [facility] [address]    [place], [place] [phone]   [facility] [address], [place], [place] [phone]   [facility] [address]  [place], [place] [phone]   [facility] - Intensive Outpatient [address], [place], [place] [phone]  [facility]  ([facility]iction [facility] [address]  [place], [place] [phone] or [phone]   [facility] ([facility]) [address] [place], [place] [phone] Accepts Medicaid  [facility] [address]  [place] [phone] Substance Abuse/Addiction Treatment   [facility] Resources Organization         Address  Phone  Notes  [facility]  [phone]) [phone]   Angie FavaJulie Brannon, PhD [address], [address] [place], [place]   [phone]) [phone] or [phone]   [facility]   [address] [place], [place] [phone]   [facility] [address], [place], [place] [phone](336) [phone] Insurance/Medicaid/sponsorship through [facility]  [facility] [address]                                    [place], [place] [phone]) [phone] Therapy/tele-psych/case  [facility] [address].   [place], [place] [phone]) [phone]    Dr. Lolly MustacheArfeen  [phone]) [phone]   [facility] [place]  [employer] [facility] 1) [address], [place] 2) [address], [place] 3)  [address], [place] [phone]) [phone]  [phone](336) [phone]   [facility] Child Abuse Hotline [phone]) [phone] or [phone] (After Hours)

## 2014-04-08 NOTE — ED Provider Notes (Signed)
CSN: [id number]     Arrival date & time 04/08/14  0945 History  This chart was scribed for non-physician practitioner, Wynetta EmeryNicole Rey Fors, PA-C, working with Tilden FossaElizabeth Rees, MD, by Ronney LionSuzanne Le, ED Scribe. This patient was seen in room TR11C/TR11C and the patient's care was started at 11:14 AM.    Chief Complaint  Patient presents with  . Abscess   The history is provided by the patient. No language interpreter was used.    HPI Comments: Tammie Webster is a 28 y.o. female who presents to the Emergency Department complaining of a right buttock abscess that began a week ago. She has had an abscess before in that same location a few months prior. She has tried bath and salts that haven't worked. She denies fever and chills, nausea, vomiting, history of diabetes, history of long-term steroid use.  Past Medical History  Diagnosis Date  . Trichomonas infection   . Chlamydia    History reviewed. No pertinent past surgical history. No family history on file. History  Substance Use Topics  . Smoking status: Current Every Day Smoker -- 0.10 packs/day    Types: Cigarettes  . Smokeless tobacco: Not on file  . Alcohol Use: No   OB History    No data available     Review of Systems  All other systems reviewed and are negative.  A complete 10 system review of systems was obtained and all systems are negative except as noted in the HPI and PMH.    Allergies  Shellfish allergy  Home Medications   Prior to Admission medications   Medication Sig Start Date End Date Taking? Authorizing Provider  medroxyPROGESTERone (DEPO-PROVERA) 150 MG/ML injection Inject 150 mg into the muscle every 3 (three) months.    Historical Provider, MD  oseltamivir (TAMIFLU) 75 MG capsule Take 1 capsule (75 mg total) by mouth every 12 (twelve) hours. 06/27/13   Rodolph BongEvan S Corey, MD   BP 112/73 mmHg  Pulse 100  Temp(Src) 98.3 F (36.8 C) (Oral)  Resp 19  Ht 5\' 3"  (1.6 m)  Wt 126 lb (57.153 kg)  BMI 22.33 kg/m2  SpO2  100% Physical Exam  Constitutional: She is oriented to person, place, and time. She appears well-developed and well-nourished. No distress.  HENT:  Head: Normocephalic.  Eyes: Conjunctivae and EOM are normal.  Cardiovascular: Normal rate.   Pulmonary/Chest: Effort normal. No stridor.  Musculoskeletal: Normal range of motion.  Neurological: She is alert and oriented to person, place, and time.  Skin:     Psychiatric: She has a normal mood and affect.  Nursing note and vitals reviewed.   ED Course  Procedures (including critical care time)  DIAGNOSTIC STUDIES: Oxygen Saturation is 100% on RA, normal by my interpretation.    COORDINATION OF CARE: 11:17 AM - Discussed treatment plan with pt at bedside which includes incision and drainage and pt agreed to plan.   INCISION AND DRAINAGE PROCEDURE NOTE: Patient identification was confirmed and verbal consent was obtained. This procedure was performed by Wynetta EmeryNicole Lake Breeding, PA-C, working with Tilden FossaElizabeth Rees, MD at 11:46 AM. Site: right gluteus Sterile procedures observed Needle size: no needle Anesthetic used (type and amt): 1% Lidocaine with Epi, 5 mL  Blade size: 11 Drainage: copious, purulent Complexity: Complex Packing used: none Site anesthetized, incision made over site, wound drained and explored loculations, rinsed with copious amounts of normal saline, wound packed with sterile gauze, covered with dry, sterile dressing.  Pt tolerated procedure well without complications.  Instructions for  care discussed verbally and pt provided with additional written instructions for homecare and f/u.  Labs Review Labs Reviewed - No data to display  Imaging Review No results found.   EKG Interpretation None      MDM   Final diagnoses:  None    Filed Vitals:   04/08/14 1015 04/08/14 1213  BP: 112/73 105/70  Pulse: 100 76  Temp: 98.3 F (36.8 C)   TempSrc: Oral   Resp: 19   Height: 5\' 3"  (1.6 m)   Weight: 126 lb  (57.153 kg)   SpO2: 100% 100%    Medications  lidocaine-EPINEPHrine (XYLOCAINE W/EPI) 2 %-1:200000 (PF) injection 10 mL (10 mLs Intradermal Given 04/08/14 1125)    Tammie NeuKamesha D Suhr is a 28 y.o. female presenting with right gluteal abscess, not close to the anal verge. I and D performed with copious amounts of purulent drainage. Wound does not packed.   Evaluation does not show pathology that would require ongoing emergent intervention or inpatient treatment. Pt is hemodynamically stable and mentating appropriately. Discussed findings and plan with patient/guardian, who agrees with care plan. All questions answered. Return precautions discussed and outpatient follow up given.   Discharge Medication List as of 04/08/2014 11:58 AM    START taking these medications   Details  HYDROcodone-acetaminophen (NORCO/VICODIN) 5-325 MG per tablet Take 1-2 tablets by mouth every 6 hours as needed for pain., Print    sulfamethoxazole-trimethoprim (SEPTRA DS) 800-160 MG per tablet Take 1 tablet by mouth every 12 (twelve) hours., Starting 04/08/2014, Until Discontinued, Print         I personally performed the services described in this documentation, which was scribed in my presence. The recorded information has been reviewed and is accurate.    Wynetta Emeryicole Azlee Monforte, PA-C 04/08/14 1706  Tilden FossaElizabeth Rees, MD 04/08/14 [phone]

## 2016-02-19 ENCOUNTER — Encounter (HOSPITAL_COMMUNITY): Payer: Self-pay | Admitting: Emergency Medicine

## 2016-02-19 ENCOUNTER — Emergency Department (HOSPITAL_COMMUNITY)
Admission: EM | Admit: 2016-02-19 | Discharge: 2016-02-19 | Disposition: A | Discharge status: Home / Self Care | Payer: Medicaid Other | Attending: Emergency Medicine | Admitting: Emergency Medicine

## 2016-02-19 DIAGNOSIS — L0231 Cutaneous abscess of buttock: Secondary | ICD-10-CM | POA: Insufficient documentation

## 2016-02-19 DIAGNOSIS — F17210 Nicotine dependence, cigarettes, uncomplicated: Secondary | ICD-10-CM | POA: Insufficient documentation

## 2016-02-19 DIAGNOSIS — L0291 Cutaneous abscess, unspecified: Secondary | ICD-10-CM

## 2016-02-19 MED ORDER — SULFAMETHOXAZOLE-TRIMETHOPRIM 800-160 MG PO TABS: 1.0000 | ORAL_TABLET | Freq: Two times a day (BID) | ORAL | 0 refills | Status: AC

## 2016-02-19 MED ORDER — LIDOCAINE HCL (PF) 1 % IJ SOLN
10.0000 mL | Freq: Once | INTRAMUSCULAR | Status: AC
  Administered 2016-02-19: 10 mL via INTRADERMAL
  Filled 2016-02-19: qty 10

## 2016-02-19 MED ORDER — CEPHALEXIN 500 MG PO CAPS: 500.0000 mg | ORAL_CAPSULE | Freq: Four times a day (QID) | ORAL | 0 refills | Status: AC

## 2016-02-19 NOTE — ED Notes (Signed)
MD at bedside, performing I&D.

## 2016-02-19 NOTE — ED Triage Notes (Signed)
Pt. reports abscess " boil" at mid buttocks onset last week with no drainage .

## 2016-02-19 NOTE — ED Provider Notes (Signed)
[facility]-EMERGENCY DEPT Provider Note   CSN: [id number] Arrival date & time: 02/19/16  2037     History   Chief Complaint Chief Complaint  Patient presents with  . Abscess    HPI Tammie Webster is a 30 y.o. female.  The history is provided by the patient.  Abscess  Location:  Torso Torso abscess location: right buttock. Size:  4x4cm Abscess quality: fluctuance, induration, painful, redness and warmth   Abscess quality: not draining   Red streaking: yes   Duration:  2 days Progression:  Worsening Pain details:    Quality:  Pressure   Severity:  Severe   Timing:  Constant Chronicity:  Recurrent (states she had similar of left buttock 1 month ago) Context: not diabetes and not immunosuppression   Relieved by:  Nothing Exacerbated by: sitting down. Associated symptoms: no anorexia, no fatigue, no fever, no headaches, no nausea and no vomiting   Risk factors: prior abscess     Past Medical History:  Diagnosis Date  . Chlamydia   . Trichomonas infection     Patient Active Problem List   Diagnosis Date Noted  . TOBACCO USER 05/23/2009  . MIGRAINE, UNSPEC., W/O INTRACTABLE MIGRAINE 07/18/2006  . GASTROESOPHAGEAL REFLUX, NO ESOPHAGITIS 07/18/2006    History reviewed. No pertinent surgical history.  OB History    No data available       Home Medications    Prior to Admission medications   Medication Sig Start Date End Date Taking? Authorizing Provider  cephALEXin (KEFLEX) 500 MG capsule Take 1 capsule (500 mg total) by mouth 4 (four) times daily. 02/19/16 02/26/16  Horald PollenAudrey Faige Seely, MD  HYDROcodone-acetaminophen (NORCO/VICODIN) 5-325 MG per tablet Take 1-2 tablets by mouth every 6 hours as needed for pain. 04/08/14   Nicole Pisciotta, PA-C  medroxyPROGESTERone (DEPO-PROVERA) 150 MG/ML injection Inject 150 mg into the muscle every 3 (three) months.    Historical Provider, MD  oseltamivir (TAMIFLU) 75 MG capsule Take 1 capsule (75 mg total) by mouth every 12 (twelve)  hours. 06/27/13   Rodolph BongEvan S Corey, MD  sulfamethoxazole-trimethoprim (BACTRIM DS,SEPTRA DS) 800-160 MG tablet Take 1 tablet by mouth 2 (two) times daily. 02/19/16 02/26/16  Horald PollenAudrey Cydne Grahn, MD    Family History No family history on file.  Social History Social History  Substance Use Topics  . Smoking status: Current Every Day Smoker    Types: Cigarettes  . Smokeless tobacco: Current User  . Alcohol use No     Allergies   Shellfish allergy   Review of Systems Review of Systems  Constitutional: Negative for fatigue and fever.  Gastrointestinal: Negative for anorexia, nausea and vomiting.  Neurological: Negative for headaches.  All other systems reviewed and are negative.    Physical Exam Updated Vital Signs BP 118/77   Pulse 92   Temp 99.4 F (37.4 C) (Oral)   Resp 16   Ht 5\' 2"  (1.575 m)   Wt 56.2 kg   LMP 02/06/2016   SpO2 99%   BMI 22.68 kg/m   Physical Exam  Constitutional: She is oriented to person, place, and time. She appears well-developed and well-nourished. No distress.  Pleasant, cooperative, well-appearing  HENT:  Head: Normocephalic and atraumatic.  Eyes: Conjunctivae are normal. No scleral icterus.  Neck: Neck supple.  Cardiovascular: Normal rate, regular rhythm and intact distal pulses.   Pulmonary/Chest: Effort normal and breath sounds normal. No respiratory distress.  Abdominal: Soft. She exhibits no distension. There is no tenderness.  Genitourinary:  Genitourinary Comments: 4x4  cm area of induration with 2x2 cm area of central fluctuance of right upper medial buttock, lateral to gluteal fold. No spontaneous drainage. No anal TTP, no connection of induration associated with abscess.  Musculoskeletal: She exhibits no edema.  Neurological: She is alert and oriented to person, place, and time. She exhibits normal muscle tone. Coordination normal.  Skin: Skin is warm and dry. No rash noted. She is not diaphoretic.  Psychiatric: She has a normal mood and  affect.  Nursing note and vitals reviewed.    ED Treatments / Results  Labs (all labs ordered are listed, but only abnormal results are displayed) Labs Reviewed - No data to display  EKG  EKG Interpretation None       Radiology No results found.  Procedures .Marland KitchenIncision and Drainage Date/Time: 02/20/2016 12:45 AM Performed by: Horald Pollen Authorized by: Horald Pollen   Consent:    Consent obtained:  Verbal   Consent given by:  Patient   Alternatives discussed:  No treatment Location:    Type:  Abscess   Size:  4x4 cm   Location:  Trunk (right buttock) Pre-procedure details:    Skin preparation:  Betadine Anesthesia (see MAR for exact dosages):    Anesthesia method:  Local infiltration   Local anesthetic:  Lidocaine 1% w/o epi Procedure type:    Complexity:  Simple Procedure details:    Incision types:  Single straight   Incision depth:  Dermal   Scalpel blade:  11   Wound management:  Probed and deloculated   Drainage:  Purulent   Drainage amount:  Copious   Wound treatment:  Wound left open   Packing materials:  1/4 in gauze Post-procedure details:    Patient tolerance of procedure:  Tolerated well, no immediate complications   (including critical care time)  Medications Ordered in ED Medications  lidocaine (PF) (XYLOCAINE) 1 % injection 10 mL (10 mLs Intradermal Given by Other 02/19/16 2156)     Initial Impression / Assessment and Plan / ED Course  I have reviewed the triage vital signs and the nursing notes.  Pertinent labs & imaging results that were available during my care of the patient were reviewed by me and considered in my medical decision making (see chart for details).  Clinical Course   Tammie Webster is a 30 y.o. female with h/o left buttock abscess last month who presents to ED for right buttock abscess today, x 2 days. No fevers/chills, nausea/vomiting, or other systemic sx. I&D as above. No anal/rectal involvement. Rx keflex  and bactrim. Advised to return to ER for any new, worse, or concerning symptoms. Pt demonstrates understanding of this and comfort with d/c home.  Pt condition, course, and discharge were discussed with attending physician Dr. Melene Plan.  Final Clinical Impressions(s) / ED Diagnoses   Final diagnoses:  Abscess    New Prescriptions Discharge Medication List as of 02/19/2016 10:46 PM    START taking these medications   Details  cephALEXin (KEFLEX) 500 MG capsule Take 1 capsule (500 mg total) by mouth 4 (four) times daily., Starting Sun 02/19/2016, Until Sun 02/26/2016, Print         Horald Pollen, MD 02/20/16 0110    Melene Plan, DO 02/20/16 2103

## 2016-06-10 ENCOUNTER — Encounter (HOSPITAL_COMMUNITY): Payer: Self-pay | Admitting: Emergency Medicine

## 2016-06-10 ENCOUNTER — Emergency Department (HOSPITAL_COMMUNITY): Payer: Medicaid Other

## 2016-06-10 ENCOUNTER — Emergency Department (HOSPITAL_COMMUNITY)
Admission: EM | Admit: 2016-06-10 | Discharge: 2016-06-11 | Disposition: A | Discharge status: Home / Self Care | Payer: Medicaid Other | Attending: Emergency Medicine | Admitting: Emergency Medicine

## 2016-06-10 DIAGNOSIS — R05 Cough: Secondary | ICD-10-CM | POA: Insufficient documentation

## 2016-06-10 DIAGNOSIS — F17210 Nicotine dependence, cigarettes, uncomplicated: Secondary | ICD-10-CM | POA: Insufficient documentation

## 2016-06-10 DIAGNOSIS — R6889 Other general symptoms and signs: Secondary | ICD-10-CM

## 2016-06-10 DIAGNOSIS — R52 Pain, unspecified: Secondary | ICD-10-CM | POA: Insufficient documentation

## 2016-06-10 NOTE — ED Provider Notes (Signed)
[facility]-EMER[facility]Y DEPT Provider Note   CSN: [id number] Arrival date & time: 06/10/16  2114   By signing my name below, I, Clarisse GougeXavier Herndon, attest that this documentation has been prepared under the direction and in the presence of Roxy Horsemanobert Nance Mccombs, PA-C. Electronically Signed: Clarisse GougeXavier Herndon, Scribe. 06/10/16. 11:28 PM.   History   Chief Complaint Chief Complaint  Patient presents with  . flu lilke symptoms   The history is provided by the patient and medical records. No language interpreter was used.    HPI Comments: Tammie Webster is a 31 y.o. female who presents to the Emergency Department complaining of right sided body aches and right chest pain x 2-3 days which is worsened brought on with coughing and deep breathing.   She states she has treated herself at home with Vick's, nyquil, onions under the feet, hot baths and moonshine with relief to a previous fever and nasal/ chest congestion. She states she is otherwise healthy and she does not take any daily medications. She also reports she quit smoking ~1 year ago.   Past Medical History:  Diagnosis Date  . Chlamydia   . Trichomonas infection     Patient Active Problem List   Diagnosis Date Noted  . TOBACCO USER 05/23/2009  . MIGRAINE, UNSPEC., W/O INTRACTABLE MIGRAINE 07/18/2006  . GASTROESOPHAGEAL REFLUX, NO ESOPHAGITIS 07/18/2006    History reviewed. No pertinent surgical history.  OB History    No data available       Home Medications    Prior to Admission medications   Medication Sig Start Date End Date Taking? Authorizing Provider  HYDROcodone-acetaminophen (NORCO/VICODIN) 5-325 MG per tablet Take 1-2 tablets by mouth every 6 hours as needed for pain. 04/08/14   Nicole Pisciotta, PA-C  medroxyPROGESTERone (DEPO-PROVERA) 150 MG/ML injection Inject 150 mg into the muscle every 3 (three) months.    Historical Provider, MD  oseltamivir (TAMIFLU) 75 MG capsule Take 1 capsule (75 mg total) by mouth every 12 (twelve)  hours. 06/27/13   Rodolph BongEvan S Corey, MD    Family History No family history on file.  Social History Social History  Substance Use Topics  . Smoking status: Current Every Day Smoker    Types: Cigarettes  . Smokeless tobacco: Current User  . Alcohol use No     Allergies   Shellfish allergy   Review of Systems Review of Systems  Constitutional: Positive for chills and fever.  HENT: Positive for congestion.   Respiratory: Positive for cough.   Cardiovascular: Positive for chest pain.  Gastrointestinal: Positive for diarrhea.  Musculoskeletal: Positive for arthralgias and myalgias.     Physical Exam Updated Vital Signs BP 127/86 (BP Location: Left Arm)   Pulse 105   Temp 99.7 F (37.6 C) (Oral)   Resp 17   Ht 5\' 3"  (1.6 m)   Wt 124 lb (56.2 kg)   LMP 06/01/2016   SpO2 98%   BMI 21.97 kg/m   Physical Exam Physical Exam  Constitutional: Pt  is oriented to person, place, and time. Appears well-developed and well-nourished. No distress.  HENT:  Head: Normocephalic and atraumatic.  Right Ear: Tympanic membrane, external ear and ear canal normal.  Left Ear: Tympanic membrane, external ear and ear canal normal.  Nose: Mucosal edema and mild rhinorrhea present. No epistaxis. Right sinus exhibits no maxillary sinus tenderness and no frontal sinus tenderness. Left sinus exhibits no maxillary sinus tenderness and no frontal sinus tenderness.  Mouth/Throat: Uvula is midline and mucous membranes are normal. Mucous  membranes are not pale and not cyanotic. No oropharyngeal exudate, posterior oropharyngeal edema, posterior oropharyngeal erythema or tonsillar abscesses.  Eyes: Conjunctivae are normal. Pupils are equal, round, and reactive to light.  Neck: Normal range of motion and full passive range of motion without pain.  Cardiovascular: Normal rate and intact distal pulses.   Pulmonary/Chest: Effort normal and breath sounds normal. No stridor.  Clear and equal breath sounds without  focal wheezes, rhonchi, rales  Abdominal: Soft. Bowel sounds are normal. There is no tenderness.  Musculoskeletal: Normal range of motion.  Lymphadenopathy:    Pthas no cervical adenopathy.  Neurological: Pt is alert and oriented to person, place, and time.  Skin: Skin is warm and dry. No rash noted. Pt is not diaphoretic.  Psychiatric: Normal mood and affect.  Nursing note and vitals reviewed.    ED Treatments / Results  DIAGNOSTIC STUDIES: Oxygen Saturation is 98% on RA, normal by my interpretation.    COORDINATION OF CARE: 11:28 PM Discussed treatment plan with pt at bedside and pt agreed to plan.  Labs (all labs ordered are listed, but only abnormal results are displayed) Labs Reviewed - No data to display  EKG  EKG Interpretation None       Radiology Dg Chest 2 View  Result Date: 06/11/2016 CLINICAL DATA:  Right-sided lateral chest pain and cough EXAM: CHEST  2 VIEW COMPARISON:  05/03/2007 CXR FINDINGS: The heart size and mediastinal contours are within normal limits. Both lungs are clear. Nipple piercings are present bilaterally. CT The visualized skeletal structures are unremarkable. IMPRESSION: No active cardiopulmonary disease. Electronically Signed   By: Tollie Eth M.D.   On: 06/11/2016 00:32    Procedures Procedures (including critical care time)  Medications Ordered in ED Medications - No data to display   Initial Impression / Assessment and Plan / ED Course  I have reviewed the triage vital signs and the nursing notes.  Pertinent labs & imaging results that were available during my care of the patient were reviewed by me and considered in my medical decision making (see chart for details).     Patient with symptoms consistent with influenza.  Vitals are stable, low-grade fever.  No signs of dehydration, tolerating PO's.  Lungs are clear. Due to patient's presentation and physical exam a chest x-ray was not ordered bc likely diagnosis of flu.  Discussed  Tamiflu.  The patient understands that symptoms are greater than the recommended 24-48 hour window of treatment.  Patient will be discharged with instructions to orally hydrate, rest, and use over-the-counter medications such as anti-inflammatories ibuprofen and Aleve for muscle aches and Tylenol for fever.  Patient will also be given a cough suppressant.     Final Clinical Impressions(s) / ED Diagnoses   Final diagnoses:  Flu-like symptoms    New Prescriptions New Prescriptions   BENZONATATE (TESSALON) 100 MG CAPSULE    Take 2 capsules (200 mg total) by mouth 2 (two) times daily as needed for cough.   CETIRIZINE (ZYRTEC ALLERGY) 10 MG TABLET    Take 1 tablet (10 mg total) by mouth daily.     Roxy Horseman, PA-C 06/11/16 5784    Shaune Pollack, MD 06/11/16 [phone]

## 2016-06-10 NOTE — ED Triage Notes (Signed)
C/o headache, non-productive cough, fever, chills, back pain, and diarrhea x 3 days.

## 2016-06-11 MED ORDER — BENZONATATE 100 MG PO CAPS: 200.0000 mg | ORAL_CAPSULE | Freq: Two times a day (BID) | ORAL | 0 refills | Status: DC | PRN

## 2016-06-11 MED ORDER — CETIRIZINE HCL 10 MG PO TABS: 10.0000 mg | ORAL_TABLET | Freq: Every day | ORAL | 1 refills | Status: DC

## 2016-06-11 NOTE — ED Notes (Signed)
Pt verbalized understanding of flu and related precautions. NAD. Ambulatory to waiting area

## 2016-06-14 ENCOUNTER — Encounter (HOSPITAL_COMMUNITY): Payer: Self-pay

## 2016-06-14 ENCOUNTER — Emergency Department (HOSPITAL_COMMUNITY)
Admission: EM | Admit: 2016-06-14 | Discharge: 2016-06-14 | Disposition: A | Discharge status: Home / Self Care | Payer: Medicaid Other | Attending: Emergency Medicine | Admitting: Emergency Medicine

## 2016-06-14 DIAGNOSIS — L0291 Cutaneous abscess, unspecified: Secondary | ICD-10-CM

## 2016-06-14 DIAGNOSIS — F17210 Nicotine dependence, cigarettes, uncomplicated: Secondary | ICD-10-CM | POA: Insufficient documentation

## 2016-06-14 DIAGNOSIS — L02415 Cutaneous abscess of right lower limb: Secondary | ICD-10-CM | POA: Insufficient documentation

## 2016-06-14 MED ORDER — LIDOCAINE HCL (PF) 1 % IJ SOLN
30.0000 mL | Freq: Once | INTRAMUSCULAR | Status: AC
  Administered 2016-06-14: 30 mL
  Filled 2016-06-14: qty 30

## 2016-06-14 MED ORDER — SULFAMETHOXAZOLE-TRIMETHOPRIM 800-160 MG PO TABS: 1.0000 | ORAL_TABLET | Freq: Two times a day (BID) | ORAL | 0 refills | Status: AC

## 2016-06-14 NOTE — ED Notes (Signed)
ED Provider at bedside. 

## 2016-06-14 NOTE — ED Triage Notes (Signed)
Pt states that she has a abscess on her r inner thigh that has been there for a day. Denies fevers.

## 2016-06-14 NOTE — ED Notes (Signed)
See EDP assessment 

## 2016-06-14 NOTE — ED Provider Notes (Signed)
[facility]-EMERGENCY DEPT Provider Note    By signing my name below, I, Earmon Phoenix, attest that this documentation has been prepared under the direction and in the presence of Felicie Morn, FNP. Electronically Signed: Earmon Phoenix, ED Scribe. 06/14/16. 9:57 PM.   History   Chief Complaint Chief Complaint  Patient presents with  . Abscess    The history is provided by the patient and medical records. No language interpreter was used.    Tammie Webster is a 31 y.o. female with PMHx of recurrent skin infections who presents to the Emergency Department complaining of an abscess located to the inner aspect of the upper right thigh that appeared two days ago. She reports associated moderate soreness. She has applied fat back to the area with minimal relief. Touching the area increases the pain. Pt denies alleviating factors. She denies fever, chills, nausea, vomiting, red streaking or drainage.    Past Medical History:  Diagnosis Date  . Chlamydia   . Trichomonas infection     Patient Active Problem List   Diagnosis Date Noted  . TOBACCO USER 05/23/2009  . MIGRAINE, UNSPEC., W/O INTRACTABLE MIGRAINE 07/18/2006  . GASTROESOPHAGEAL REFLUX, NO ESOPHAGITIS 07/18/2006    History reviewed. No pertinent surgical history.  OB History    No data available       Home Medications    Prior to Admission medications   Medication Sig Start Date End Date Taking? Authorizing Provider  benzonatate (TESSALON) 100 MG capsule Take 2 capsules (200 mg total) by mouth 2 (two) times daily as needed for cough. 06/11/16   Roxy Horseman, PA-C  cetirizine (ZYRTEC ALLERGY) 10 MG tablet Take 1 tablet (10 mg total) by mouth daily. 06/11/16   Roxy Horseman, PA-C  HYDROcodone-acetaminophen (NORCO/VICODIN) 5-325 MG per tablet Take 1-2 tablets by mouth every 6 hours as needed for pain. 04/08/14   Nicole Pisciotta, PA-C  medroxyPROGESTERone (DEPO-PROVERA) 150 MG/ML injection Inject 150 mg into the muscle  every 3 (three) months.    Historical Provider, MD  oseltamivir (TAMIFLU) 75 MG capsule Take 1 capsule (75 mg total) by mouth every 12 (twelve) hours. 06/27/13   Rodolph Bong, MD    Family History No family history on file.  Social History Social History  Substance Use Topics  . Smoking status: Current Every Day Smoker    Types: Cigarettes  . Smokeless tobacco: Current User  . Alcohol use No     Allergies   Shellfish allergy   Review of Systems Review of Systems  Constitutional: Negative for chills and fever.  Gastrointestinal: Negative for abdominal pain, nausea and vomiting.  Skin: Positive for color change (abscess to right inner thigh).  All other systems reviewed and are negative.    Physical Exam Updated Vital Signs BP 113/78   Pulse 87   Temp 98.8 F (37.1 C) (Oral)   Resp 18   Ht 5\' 2"  (1.575 m)   Wt 124 lb (56.2 kg)   LMP 06/01/2016   SpO2 98%   BMI 22.68 kg/m   Physical Exam  Constitutional: She is oriented to person, place, and time. She appears well-developed and well-nourished.  HENT:  Head: Normocephalic and atraumatic.  Neck: Normal range of motion.  Cardiovascular: Normal rate, regular rhythm and normal heart sounds.  Exam reveals no gallop and no friction rub.   No murmur heard. Pulmonary/Chest: Effort normal and breath sounds normal. No respiratory distress. She has no wheezes. She has no rales.  Musculoskeletal: Normal range of motion.  Neurological: She is alert and oriented to person, place, and time.  Skin: Skin is warm and dry.  4.5 x 2.5 cm area of induration and fluctuance to inner aspect of right upper thigh near inguinal fold  Psychiatric: She has a normal mood and affect. Her behavior is normal.  Nursing note and vitals reviewed.    ED Treatments / Results  DIAGNOSTIC STUDIES: Oxygen Saturation is 98% on RA, normal by my interpretation.   COORDINATION OF CARE: 8:52 PM- Will incise and drain abscess. Pt verbalizes  understanding and agrees to plan.  INCISION AND DRAINAGE PROCEDURE NOTE: Patient identification was confirmed and verbal consent was obtained. This procedure was performed by Felicie Mornavid Bera Pinela, FNP at 9:39 PM. Site: inner aspect of upper right thigh near inguinal fold Sterile procedures observed Needle size: 27 G Anesthetic used (type and amt): Lidocaine 1% without Epinephrine (13 mLs) Blade size: 11 Drainage: copious purulent Complexity: Complex Packing used: n/a Site anesthetized, incision made over site, wound drained and explored loculations, rinsed with copious amounts of normal saline, covered with dry, sterile dressing. Pt tolerated procedure well without complications. Instructions for care discussed verbally and pt provided with additional written instructions for homecare and f/u.    Medications  lidocaine (PF) (XYLOCAINE) 1 % injection 30 mL (30 mLs Infiltration Given 06/14/16 2119)    Labs (all labs ordered are listed, but only abnormal results are displayed) Labs Reviewed - No data to display  EKG  EKG Interpretation None       Radiology No results found.  Procedures Procedures (including critical care time)  Medications Ordered in ED Medications  lidocaine (PF) (XYLOCAINE) 1 % injection 30 mL (30 mLs Infiltration Given 06/14/16 2119)     Initial Impression / Assessment and Plan / ED Course  I have reviewed the triage vital signs and the nursing notes.  Pertinent labs & imaging results that were available during my care of the patient were reviewed by me and considered in my medical decision making (see chart for details).     Patient with skin abscess. Incision and drainage performed in the ED today. Abscess was not large enough to warrant packing or drain placement. Wound recheck in 2 days. Supportive care and return precautions discussed. Pt sent home with Bactrim. The patient appears reasonably screened and/or stabilized for discharge and I doubt any other  emergent medical condition requiring further screening, evaluation, or treatment in the ED prior to discharge.    I personally performed the services described in this documentation, which was scribed in my presence. The recorded information has been reviewed and is accurate.    Final Clinical Impressions(s) / ED Diagnoses   Final diagnoses:  Abscess    New Prescriptions Discharge Medication List as of 06/14/2016 10:04 PM    START taking these medications   Details  sulfamethoxazole-trimethoprim (BACTRIM DS,SEPTRA DS) 800-160 MG tablet Take 1 tablet by mouth 2 (two) times daily., Starting Thu 06/14/2016, Until Thu 06/21/2016, Print         Felicie Mornavid Abreanna Drawdy, NP 06/14/16 [id number]    Lavera Guiseana Duo Liu, MD 06/19/16 1210

## 2017-03-11 ENCOUNTER — Emergency Department (HOSPITAL_COMMUNITY)
Admission: EM | Admit: 2017-03-11 | Discharge: 2017-03-11 | Disposition: A | Discharge status: Home / Self Care | Payer: Self-pay | Attending: Physician Assistant | Admitting: Physician Assistant

## 2017-03-11 ENCOUNTER — Encounter (HOSPITAL_COMMUNITY): Payer: Self-pay

## 2017-03-11 DIAGNOSIS — Z79899 Other long term (current) drug therapy: Secondary | ICD-10-CM | POA: Insufficient documentation

## 2017-03-11 DIAGNOSIS — F17210 Nicotine dependence, cigarettes, uncomplicated: Secondary | ICD-10-CM | POA: Insufficient documentation

## 2017-03-11 DIAGNOSIS — L0231 Cutaneous abscess of buttock: Secondary | ICD-10-CM | POA: Insufficient documentation

## 2017-03-11 MED ORDER — LIDOCAINE-EPINEPHRINE (PF) 2 %-1:200000 IJ SOLN
20.0000 mL | Freq: Once | INTRAMUSCULAR | Status: AC
  Administered 2017-03-11: 20 mL via INTRADERMAL
  Filled 2017-03-11: qty 20

## 2017-03-11 MED ORDER — DOXYCYCLINE HYCLATE 100 MG PO CAPS: 100.0000 mg | ORAL_CAPSULE | Freq: Two times a day (BID) | ORAL | 0 refills | Status: DC

## 2017-03-11 NOTE — ED Provider Notes (Signed)
[facility]   CSN: [id number] Arrival date & time: 03/11/17  [id number]     History   Chief Complaint Chief Complaint  Patient presents with  . Recurrent Skin Infections    HPI Tammie Webster is a 31 y.o. female.  HPI  Ms. Tammie Webster is a 31 year old female with a history of tobacco use, GERD who presents the emergency department for evaluation of "boil on my right buttocks." Patient states that she began having pain and noticed an area of swelling on her right buttocks approximately one week ago. Since that time the area of swelling has grown in size, is erythematous and warm to the touch. At this time pain is 10/10 in severity, throbbing and constant. It is worsened when she sits or applies pressure to the area. She has not taken any over-the-counter medication for her pain. She denies fever, drainage from the site, painful bowel movement, rectal discharge, abscess or wound elsewhere. Patient states that she has had abscesses in the past, that her family is prone to getting them.  Past Medical History:  Diagnosis Date  . Chlamydia   . Trichomonas infection     Patient Active Problem List   Diagnosis Date Noted  . TOBACCO USER 05/23/2009  . MIGRAINE, UNSPEC., W/O INTRACTABLE MIGRAINE 07/18/2006  . GASTROESOPHAGEAL REFLUX, NO ESOPHAGITIS 07/18/2006    History reviewed. No pertinent surgical history.  OB History    No data available       Home Medications    Prior to Admission medications   Medication Sig Start Date End Date Taking? Authorizing Provider  benzonatate (TESSALON) 100 MG capsule Take 2 capsules (200 mg total) by mouth 2 (two) times daily as needed for cough. 06/11/16   Roxy HorsemanBrowning, Robert, PA-C  cetirizine (ZYRTEC ALLERGY) 10 MG tablet Take 1 tablet (10 mg total) by mouth daily. 06/11/16   Roxy HorsemanBrowning, Robert, PA-C  doxycycline (VIBRAMYCIN) 100 MG capsule Take 1 capsule (100 mg total) by mouth 2 (two) times daily.  03/11/17   Kellie ShropshireShrosbree, Emily J, PA-C  HYDROcodone-acetaminophen (NORCO/VICODIN) 5-325 MG per tablet Take 1-2 tablets by mouth every 6 hours as needed for pain. 04/08/14   Pisciotta, Joni ReiningNicole, PA-C  medroxyPROGESTERone (DEPO-PROVERA) 150 MG/ML injection Inject 150 mg into the muscle every 3 (three) months.    [provider]  oseltamivir (TAMIFLU) 75 MG capsule Take 1 capsule (75 mg total) by mouth every 12 (twelve) hours. 06/27/13   Rodolph Bongorey, Evan S, MD    Family History History reviewed. No pertinent family history.  Social History Social History  Substance Use Topics  . Smoking status: Current Every Day Smoker    Types: Cigarettes  . Smokeless tobacco: Current User  . Alcohol use No     Allergies   Shellfish allergy   Review of Systems Review of Systems  Constitutional: Negative for chills, fatigue and fever.  Gastrointestinal: Negative for abdominal pain, diarrhea, nausea, rectal pain and vomiting.  Genitourinary: Negative for difficulty urinating.  Musculoskeletal: Negative for arthralgias, back pain, gait problem, joint swelling and myalgias.  Skin: Positive for color change and wound (boil).     Physical Exam Updated Vital Signs BP 107/78 (BP Location: Right Arm)   Pulse 87   Temp 98.5 F (36.9 C) (Oral)   Resp 16   LMP 03/07/2017 (Exact Date)   SpO2 99%   Physical Exam  Constitutional: She is oriented to person, place, and time. She appears well-developed and well-nourished. No distress.  HENT:  Head: Normocephalic and atraumatic.  Eyes: Right eye exhibits no discharge. Left eye exhibits no discharge.  Pulmonary/Chest: Effort normal. No respiratory distress.  Genitourinary:  Genitourinary Comments: Chaperone present for exam. No gross blood noted on rectal exam, normal tone, no tenderness, no mass or fissure, no hemorrhoids noted.  Neurological: She is alert and oriented to person, place, and time. Coordination normal.  Skin: Skin is warm and dry. She is  not diaphoretic.  Right buttocks with 3 cm raised, circular area which is warm, erythematous and tender to the touch. Moderate surrounding erythema. No drainage noted.  Psychiatric: She has a normal mood and affect. Her behavior is normal.  Nursing note and vitals reviewed.    ED Treatments / Results  Labs (all labs ordered are listed, but only abnormal results are displayed) Labs Reviewed - No data to display  EKG  EKG Interpretation None       Radiology No results found.  Procedures .Marland KitchenIncision and Drainage Date/Time: 03/11/2017 11:20 AM Performed by: Kellie Shropshire Authorized by: Kellie Shropshire   Consent:    Consent obtained:  Verbal and emergent situation   Consent given by:  Patient   Risks discussed:  Bleeding, incomplete drainage, infection and pain   Alternatives discussed:  No treatment Location:    Type:  Abscess   Size:  3cm   Location:  Lower extremity   Lower extremity location:  Buttock   Buttock location:  R buttock Pre-procedure details:    Skin preparation:  Betadine Anesthesia (see MAR for exact dosages):    Anesthesia method:  Local infiltration   Local anesthetic:  Lidocaine 2% WITH epi Procedure type:    Complexity:  Simple Procedure details:    Incision types:  Single straight   Scalpel blade:  11   Wound management:  Probed and deloculated, irrigated with saline and extensive cleaning   Drainage:  Purulent   Drainage amount:  Copious   Wound treatment:  Wound left open   Packing materials:  None Post-procedure details:    Patient tolerance of procedure:  Tolerated well, no immediate complications    (including critical care time)  Medications Ordered in ED Medications  lidocaine-EPINEPHrine (XYLOCAINE W/EPI) 2 %-1:200000 (PF) injection 20 mL (20 mLs Intradermal Given 03/11/17 1125)     Initial Impression / Assessment and Plan / ED Course  I have reviewed the triage vital signs and the nursing notes.  Pertinent labs &  imaging results that were available during my care of the patient were reviewed by me and considered in my medical decision making (see chart for details).    Patient with skin abscess amenable to incision and drainage.  Abscess was not large enough to warrant packing or drain,  wound recheck in 2 days. Encouraged home warm soaks and flushing.  Signs of cellulitis is surrounding skin.  Will d/c to home with antibiotic therapy. Discussed return precautions including fever, worsening erythema/tenderness/swelling and patient agrees and voices understanding.    Final Clinical Impressions(s) / ED Diagnoses   Final diagnoses:  Abscess of buttock, right    New Prescriptions New Prescriptions   DOXYCYCLINE (VIBRAMYCIN) 100 MG CAPSULE    Take 1 capsule (100 mg total) by mouth 2 (two) times daily.     Kellie Shropshire, PA-C 03/11/17 1214    Abelino Derrick, MD 03/11/17 1606

## 2017-03-11 NOTE — ED Notes (Signed)
C/o boil on her right buttuck

## 2017-03-11 NOTE — Discharge Instructions (Signed)
Please fill your prescription for the antibiotic and take this twice a day for the next 10days.   Please gently apply soap and water daily over the site until it is closed.   Please return to the ER or your primary doctor in two days for wound recheck.  Return to the ER if you experience fever, worsening pain/redness around the right buttock or you have any new or concerning symptoms.

## 2017-03-11 NOTE — ED Triage Notes (Signed)
Pt states she has a boil on her right buttocks. Pt afebrile. Has hx of boils in the past. No hx of diabetes.

## 2017-03-11 NOTE — ED Notes (Signed)
PA Shrosbree at bedside

## 2018-01-03 ENCOUNTER — Other Ambulatory Visit: Payer: Self-pay

## 2018-01-03 ENCOUNTER — Emergency Department (HOSPITAL_COMMUNITY)
Admission: EM | Admit: 2018-01-03 | Discharge: 2018-01-03 | Disposition: A | Discharge status: Home / Self Care | Payer: Self-pay | Attending: Emergency Medicine | Admitting: Emergency Medicine

## 2018-01-03 ENCOUNTER — Encounter (HOSPITAL_COMMUNITY): Payer: Self-pay | Admitting: Emergency Medicine

## 2018-01-03 DIAGNOSIS — Z720 Tobacco use: Secondary | ICD-10-CM

## 2018-01-03 DIAGNOSIS — F17210 Nicotine dependence, cigarettes, uncomplicated: Secondary | ICD-10-CM | POA: Insufficient documentation

## 2018-01-03 DIAGNOSIS — Z79899 Other long term (current) drug therapy: Secondary | ICD-10-CM | POA: Insufficient documentation

## 2018-01-03 DIAGNOSIS — B9789 Other viral agents as the cause of diseases classified elsewhere: Secondary | ICD-10-CM

## 2018-01-03 DIAGNOSIS — R062 Wheezing: Secondary | ICD-10-CM

## 2018-01-03 DIAGNOSIS — J069 Acute upper respiratory infection, unspecified: Secondary | ICD-10-CM

## 2018-01-03 MED ORDER — AEROCHAMBER PLUS FLO-VU LARGE MISC
Status: AC
  Filled 2018-01-03: qty 1

## 2018-01-03 MED ORDER — PREDNISONE 10 MG (21) PO TBPK: ORAL_TABLET | ORAL | 0 refills | Status: DC

## 2018-01-03 MED ORDER — ALBUTEROL SULFATE HFA 108 (90 BASE) MCG/ACT IN AERS
2.0000 | INHALATION_SPRAY | Freq: Once | RESPIRATORY_TRACT | Status: AC
  Administered 2018-01-03: 2 via RESPIRATORY_TRACT
  Filled 2018-01-03: qty 6.7

## 2018-01-03 MED ORDER — IPRATROPIUM-ALBUTEROL 0.5-2.5 (3) MG/3ML IN SOLN
3.0000 mL | Freq: Once | RESPIRATORY_TRACT | Status: AC
  Administered 2018-01-03: 3 mL via RESPIRATORY_TRACT
  Filled 2018-01-03: qty 3

## 2018-01-03 MED ORDER — AEROCHAMBER PLUS FLO-VU LARGE MISC
1.0000 | Freq: Once | Status: AC
  Administered 2018-01-03: 1

## 2018-01-03 NOTE — ED Triage Notes (Signed)
Pt states she works 3 jobs and is somewhat worn down.  Has dry cough, no hx of asthma. Chills and aching with cough.  Nasal congestion.

## 2018-01-03 NOTE — ED Notes (Signed)
Called patient to update vitals patient did not answer.

## 2018-01-03 NOTE — ED Provider Notes (Signed)
[facility]   CSN: [id number] Arrival date & time: 01/03/18  0132     History   Chief Complaint Chief Complaint  Patient presents with  . URI    HPI Tammie Webster is a 32 y.o. female who presents the emergency department chief complaint of symptoms of URI.  Patient had onset of nasal congestion, cough which is not productive and wheezing beginning 3 days ago.  She denies a previous history of wheezing however she is a daily tobacco smoker.  Her boyfriend who was at bedside has had similar symptoms.  She tried TheraFlu nighttime which was very helpful for sleep.  She denies fever or chills.  She complains of chest tightness and paroxysms of unrelenting cough.  The cough is painful.  She has not had any fever, chills.  She denies sore throat or ear pain.  HPI  Past Medical History:  Diagnosis Date  . Chlamydia   . Trichomonas infection     Patient Active Problem List   Diagnosis Date Noted  . TOBACCO USER 05/23/2009  . MIGRAINE, UNSPEC., W/O INTRACTABLE MIGRAINE 07/18/2006  . GASTROESOPHAGEAL REFLUX, NO ESOPHAGITIS 07/18/2006    History reviewed. No pertinent surgical history.   OB History   None      Home Medications    Prior to Admission medications   Medication Sig Start Date End Date Taking? Authorizing Provider  benzonatate (TESSALON) 100 MG capsule Take 2 capsules (200 mg total) by mouth 2 (two) times daily as needed for cough. 06/11/16   Roxy HorsemanBrowning, Robert, PA-C  cetirizine (ZYRTEC ALLERGY) 10 MG tablet Take 1 tablet (10 mg total) by mouth daily. 06/11/16   Roxy HorsemanBrowning, Robert, PA-C  doxycycline (VIBRAMYCIN) 100 MG capsule Take 1 capsule (100 mg total) by mouth 2 (two) times daily. 03/11/17   Kellie ShropshireShrosbree, Emily J, PA-C  HYDROcodone-acetaminophen (NORCO/VICODIN) 5-325 MG per tablet Take 1-2 tablets by mouth every 6 hours as needed for pain. 04/08/14   Pisciotta, Joni ReiningNicole, PA-C  medroxyPROGESTERone (DEPO-PROVERA) 150  MG/ML injection Inject 150 mg into the muscle every 3 (three) months.    [provider]  oseltamivir (TAMIFLU) 75 MG capsule Take 1 capsule (75 mg total) by mouth every 12 (twelve) hours. 06/27/13   Rodolph Bongorey, Evan S, MD  predniSONE (STERAPRED UNI-PAK 21 TAB) 10 MG (21) TBPK tablet Use as directed 01/03/18   Arthor CaptainHarris, Jarris Kortz, PA-C    Family History No family history on file.  Social History Social History   Tobacco Use  . Smoking status: Current Every Day Smoker    Types: Cigarettes  . Smokeless tobacco: Current User  Substance Use Topics  . Alcohol use: No  . Drug use: No     Allergies   Shellfish allergy   Review of Systems Review of Systems Ten systems reviewed and are negative for acute change, except as noted in the HPI.    Physical Exam Updated Vital Signs BP 114/77 (BP Location: Right Arm)   Pulse 88   Temp 98.5 F (36.9 C) (Oral)   Resp 16   SpO2 97%   Physical Exam  Physical Exam  Nursing note and vitals reviewed. Constitutional: She is oriented to person, place, and time. She appears well-developed and well-nourished. No distress.  Appears ill. HENT:  Head: Normocephalic and atraumatic. Facial swelling in the sinus region, no sinus tenderness Eyes: Conjunctivae normal and EOM are normal. Pupils are equal, round, and reactive to light. No scleral icterus. Glassy, heavy, ill-appearing eyes. Neck:  Normal range of motion. No cervical lymphadenopathy Mouth: Oropharynx clear and normal without exudates, uvula midline Cardiovascular: Normal rate, regular rhythm and normal heart sounds.  Exam reveals no gallop and no friction rub.   No murmur heard. Pulmonary/Chest: Effort normal tight, bronchitic cough with expiratory wheezes.  No respiratory distress.  Abdominal: Soft. Bowel sounds are normal. She exhibits no distension and no mass. There is no tenderness. There is no guarding.  Neurological: She is alert and oriented to person, place, and time.  Skin: Skin  is warm and dry. She is not diaphoretic.    ED Treatments / Results  Labs (all labs ordered are listed, but only abnormal results are displayed) Labs Reviewed - No data to display  EKG None  Radiology No results found.  Procedures Procedures (including critical care time)  Medications Ordered in ED Medications  ipratropium-albuterol (DUONEB) 0.5-2.5 (3) MG/3ML nebulizer solution 3 mL (has no administration in time range)  AEROCHAMBER PLUS FLO-VU LARGE MISC 1 each (has no administration in time range)  albuterol (PROVENTIL HFA;VENTOLIN HFA) 108 (90 Base) MCG/ACT inhaler 2 puff (has no administration in time range)     Initial Impression / Assessment and Plan / ED Course  I have reviewed the triage vital signs and the nursing notes.  Pertinent labs & imaging results that were available during my care of the patient were reviewed by me and considered in my medical decision making (see chart for details).     She with 3 days of URI symptoms.  Do not feel that imaging is warranted. The patient was counseled on the dangers of tobacco use, and was advised to quit.  Reviewed strategies to maximize success, including removing cigarettes and smoking materials from environment, stress management, substitution of other forms of reinforcement, support of family/friends and written materials. The patients symptoms are consistent with URI, likely viral etiology. Discussed that antibiotics are not indicated for viral infections. Pt will be discharged with symptomatic treatment.  Verbalizes understanding and is agreeable with plan. Pt is hemodynamically stable & in NAD prior to dc.   Final Clinical Impressions(s) / ED Diagnoses   Final diagnoses:  Viral URI with cough  Wheezing  Tobacco abuse    ED Discharge Orders         Ordered    predniSONE (STERAPRED UNI-PAK 21 TAB) 10 MG (21) TBPK tablet     01/03/18 0635           Arthor CaptainHarris, Salayah Meares, PA-C 01/03/18 0644    Melene PlanFloyd, Dan,  DO 01/03/18 [phone]

## 2018-01-03 NOTE — Discharge Instructions (Signed)
You appear to have an upper respiratory infection (URI). An upper respiratory tract infection, or cold, is a viral infection of the air passages leading to the lungs. It is contagious and can be spread to others, especially during the first 3 or 4 days. It cannot be cured by antibiotics or other medicines. °RETURN IMMEDIATELY IF you develop shortness of breath, confusion or altered mental status, a new rash, become dizzy, faint, or poorly responsive, or are unable to be cared for at home. ° °

## 2019-02-02 ENCOUNTER — Ambulatory Visit (INDEPENDENT_AMBULATORY_CARE_PROVIDER_SITE_OTHER): Payer: Medicaid Other

## 2019-02-02 ENCOUNTER — Ambulatory Visit (INDEPENDENT_AMBULATORY_CARE_PROVIDER_SITE_OTHER): Payer: Self-pay | Admitting: Podiatry

## 2019-02-02 ENCOUNTER — Encounter: Payer: Self-pay | Admitting: Podiatry

## 2019-02-02 ENCOUNTER — Other Ambulatory Visit: Payer: Self-pay

## 2019-02-02 VITALS — BP 89/53 | HR 68 | Resp 16

## 2019-02-02 DIAGNOSIS — M2011 Hallux valgus (acquired), right foot: Secondary | ICD-10-CM

## 2019-02-02 DIAGNOSIS — M79673 Pain in unspecified foot: Secondary | ICD-10-CM

## 2019-02-02 DIAGNOSIS — M2012 Hallux valgus (acquired), left foot: Secondary | ICD-10-CM

## 2019-02-02 NOTE — Patient Instructions (Signed)
Pre-Operative Instructions  Congratulations, you have decided to take an important step towards improving your quality of life.  You can be assured that the doctors and staff at [facility] will be with you every step of the way.  Here are some important things you should know:  1. Plan to be at the surgery center/hospital at least 1 (one) hour prior to your scheduled time, unless otherwise directed by the surgical center/hospital staff.  You must have a responsible adult accompany you, remain during the surgery and drive you home.  Make sure you have directions to the surgical center/hospital to ensure you arrive on time. 2. If you are having surgery at [facility] or [facility], you will need a copy of your medical history and physical form from your family physician within one month prior to the date of surgery. We will give you a form for your primary physician to complete.  3. We make every effort to accommodate the date you request for surgery.  However, there are times where surgery dates or times have to be moved.  We will contact you as soon as possible if a change in schedule is required.   4. No aspirin/ibuprofen for one week before surgery.  If you are on aspirin, any non-steroidal anti-inflammatory medications (Mobic, Aleve, Ibuprofen) should not be taken seven (7) days prior to your surgery.  You make take Tylenol for pain prior to surgery.  5. Medications - If you are taking daily heart and blood pressure medications, seizure, reflux, allergy, asthma, anxiety, pain or diabetes medications, make sure you notify the surgery center/hospital before the day of surgery so they can tell you which medications you should take or avoid the day of surgery. 6. No food or drink after midnight the night before surgery unless directed otherwise by surgical center/hospital staff. 7. No alcoholic beverages 24-hours prior to surgery.  No smoking 24-hours prior or 24-hours after  surgery. 8. Wear loose pants or shorts. They should be loose enough to fit over bandages, boots, and casts. 9. Don't wear slip-on shoes. Sneakers are preferred. 10. Bring your boot with you to the surgery center/hospital.  Also bring crutches or a walker if your physician has prescribed it for you.  If you do not have this equipment, it will be provided for you after surgery. 11. If you have not been contacted by the surgery center/hospital by the day before your surgery, call to confirm the date and time of your surgery. 12. Leave-time from work may vary depending on the type of surgery you have.  Appropriate arrangements should be made prior to surgery with your employer. 13. Prescriptions will be provided immediately following surgery by your doctor.  Fill these as soon as possible after surgery and take the medication as directed. Pain medications will not be refilled on weekends and must be approved by the doctor. 14. Remove nail polish on the operative foot and avoid getting pedicures prior to surgery. 15. Wash the night before surgery.  The night before surgery wash the foot and leg well with water and the antibacterial soap provided. Be sure to pay special attention to beneath the toenails and in between the toes.  Wash for at least three (3) minutes. Rinse thoroughly with water and dry well with a towel.  Perform this wash unless told not to do so by your physician.  Enclosed: 1 Ice pack (please put in freezer the night before surgery)   1 Hibiclens skin cleaner     Pre-op instructions  If you have any questions regarding the instructions, please do not hesitate to call our office.  [place]: [address], [place], [place] [address] -- [phone]  [place]: [address] [place], [place] [address] -- [phone]  [place]: [address]  [place],  [address] -- [phone]   Website: [email or web address] 

## 2019-02-05 ENCOUNTER — Telehealth: Payer: Self-pay | Admitting: *Deleted

## 2019-02-05 NOTE — Telephone Encounter (Signed)
"  I'm calling to schedule my appointment.  He said it would be in January sometime.  So I'm calling to schedule some time for that.  Have a good day.  Could you give me a call back."

## 2019-02-06 NOTE — Telephone Encounter (Signed)
I am returning your call you want to schedule your surgery with Dr. Jacqualyn Posey.  "Yes, he said we could do it in January."  Dr. Jacqualyn Posey can do it on January 6, 13, 20 or 27 of 2021.  "Let's schedule it for the sixth."  I'll get it scheduled for 05/27/2019.

## 2019-02-12 NOTE — Progress Notes (Signed)
Subjective:   Patient ID: Tammie Webster, female   DOB: 33 y.o.   MRN: [id number]   HPI 33 year old female presents the office today with concerns of pain to both of her feet with the right side worse than left and she points on the bunion site, first MPJ where she gets majority discomfort.  She is tried offloading padding with a significant improvement.  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  She states it hurts getting in and out of the truck as well.   Review of Systems  All other systems reviewed and are negative.  Past Medical History:  Diagnosis Date  . Chlamydia   . Trichomonas infection     No past surgical history on file.  No current outpatient medications on file.  Allergies  Allergen Reactions  . Shellfish Allergy Anaphylaxis         Objective:  Physical Exam  General: AAO x3, NAD  Dermatological: Skin is warm, dry and supple bilateral. Nails x 10 are well manicured; remaining integument appears unremarkable at this time. There are no open sores, no preulcerative lesions, no rash or signs of infection present.  Vascular: Dorsalis Pedis artery and Posterior Tibial artery pedal pulses are 2/4 bilateral with immedate capillary fill time. Pedal hair growth present. No varicosities and no lower extremity edema present bilateral. There is no pain with calf compression, swelling, warmth, erythema.   Neruologic: Grossly intact via light touch bilateral. Vibratory intact via tuning fork bilateral. Protective threshold with Semmes Wienstein monofilament intact to all pedal sites bilateral.   Musculoskeletal: Moderate bunion deformities present bilaterally.  No crepitation restriction with MPJ range of motion no first ray hypermobility present.  Mild erythema on the bunion site from irritation inside shoes.  No skin breakdown or signs of infection.  Muscular strength 5/5 in all groups tested bilateral.  Gait:  Unassisted, Nonantalgic.      Assessment:   Symptomatic bunion bilateral with right > left     Plan:  -Treatment options discussed including all alternatives, risks, and complications -Etiology of symptoms were discussed -X-rays were obtained and reviewed with the patient.  No evidence of acute fracture or stress fracture.  Moderate bunion deformities present. -We discussed both conservative as well as surgical treatment options.  Ultimately she was have the area fixed.  We discussed bunionectomy with screw fixation.  In the meantime she is to continue with offloading pads, shoe modifications and also discussed orthotics.  She like to proceed with surgery after the first of the year   Trula Slade DPM

## 2019-03-05 ENCOUNTER — Telehealth: Payer: Self-pay | Admitting: *Deleted

## 2019-03-05 NOTE — Telephone Encounter (Signed)
I am calling to let you know that Dr. Jacqualyn Posey wants to see you prior to your scheduled surgery on 05/26/2018 on January 4 or 5.  Would you like to schedule that appointment now?  "Yes, that is fine."  I'll transfer you to an appointment scheduler.  I transferred her to [place].

## 2019-05-04 ENCOUNTER — Ambulatory Visit: Payer: Medicaid Other | Admitting: Podiatry

## 2019-05-19 ENCOUNTER — Telehealth: Payer: Self-pay | Admitting: *Deleted

## 2019-05-19 NOTE — Telephone Encounter (Signed)
I left the patient a message to call me back.  She's scheduled to have surgery on 05/27/2019.  However, she has Medicaid Family Planning.  This insurance will not cover surgery and possibly not the office visits.  I want to find out if she has another insurance, so we can file her claims.  I am calling to see if you have a different insurance that we can file for your surgery.  "No, I been paying out of pocket."  You're scheduled for surgery on May 27, 2019.  Are you going to pay for your surgery out of pocket?  "It depends on how much it is.  I was calling about rescheduling my surgery anyway."  Would you like an estimate?  "Yes, I would like to know how much I'll have to pay."  I'll ask Jocelyn Lamer in our insurance department to call you and give you an estimate.  Please keep in mind, this is only the doctor's fee, you must speak to someone at the surgery center to get the facility fee and the anesthesia fee.  I will cancel your surgery   I called and left a message for Renee Little at [facility], asking her to cancel Ms. Lykins's surgery that's scheduled for 05/27/2019 because she does not have any insurance.

## 2019-07-24 ENCOUNTER — Emergency Department (HOSPITAL_COMMUNITY)
Admission: EM | Admit: 2019-07-24 | Discharge: 2019-07-24 | Disposition: A | Discharge status: Home / Self Care | Payer: Medicaid Other | Attending: Emergency Medicine | Admitting: Emergency Medicine

## 2019-07-24 ENCOUNTER — Other Ambulatory Visit: Payer: Self-pay

## 2019-07-24 ENCOUNTER — Encounter (HOSPITAL_COMMUNITY): Payer: Self-pay

## 2019-07-24 DIAGNOSIS — R42 Dizziness and giddiness: Secondary | ICD-10-CM | POA: Insufficient documentation

## 2019-07-24 DIAGNOSIS — F17210 Nicotine dependence, cigarettes, uncomplicated: Secondary | ICD-10-CM | POA: Insufficient documentation

## 2019-07-24 LAB — CBG MONITORING, ED: Glucose-Capillary: 64 mg/dL — ABNORMAL LOW (ref 70–99)

## 2019-07-24 LAB — PREGNANCY, URINE: Preg Test, Ur: NEGATIVE

## 2019-07-24 MED ORDER — MECLIZINE HCL 25 MG PO TABS: 25.0000 mg | ORAL_TABLET | Freq: Three times a day (TID) | ORAL | 0 refills | Status: AC | PRN

## 2019-07-24 NOTE — ED Notes (Signed)
Pt given soda, crackers, and peanut butter since her CBG 64.

## 2019-07-24 NOTE — ED Provider Notes (Signed)
[facility]-EMERGENCY DEPT [facility] Emergency Department Provider Note MRN:  [id number]  Arrival date & time: 07/24/19     Chief Complaint   Dizziness   History of Present Illness   Tammie Webster is a 34 y.o. year-old female with no pertinent past medical history presenting to the ED with chief complaint of dizziness.  2 or 3 days of intermittent dizziness, described as a floating in the ocean sensation at times but also described as a lightheadedness that is occasionally worse with standing, at times worse with certain head positions.  Explains that she works every day as a Music therapist, does not drink water, drinks [employer] all day, smokes pack of cigarettes every day.  Denies fever, no numbness or weakness, no headache, no chest pain, no shortness of breath, no abdominal pain.  Frequent urination.  Review of Systems  A complete 10 system review of systems was obtained and all systems are negative except as noted in the HPI and PMH.   Patient's Health History    Past Medical History:  Diagnosis Date  . Chlamydia   . Trichomonas infection     History reviewed. No pertinent surgical history.  Family History  Problem Relation Age of Onset  . Sickle cell anemia Mother   . Hypertension Mother   . Hypertension Father     Social History   Socioeconomic History  . Marital status: Single    Spouse name: Not on file  . Number of children: Not on file  . Years of education: Not on file  . Highest education level: Not on file  Occupational History  . Not on file  Tobacco Use  . Smoking status: Current Every Day Smoker    Packs/day: 1.00    Types: Cigarettes  . Smokeless tobacco: Never Used  Substance and Sexual Activity  . Alcohol use: No  . Drug use: No  . Sexual activity: Yes    Birth control/protection: Injection  Other Topics Concern  . Not on file  Social History Narrative  . Not on file   Social Determinants of Health   Financial Resource Strain:   .  Difficulty of Paying Living Expenses: Not on file  Food Insecurity:   . Worried About Programme researcher, broadcasting/film/video in the Last Year: Not on file  . Ran Out of Food in the Last Year: Not on file  Transportation Needs:   . Lack of Transportation (Medical): Not on file  . Lack of Transportation (Non-Medical): Not on file  Physical Activity:   . Days of Exercise per Week: Not on file  . Minutes of Exercise per Session: Not on file  Stress:   . Feeling of Stress : Not on file  Social Connections:   . Frequency of Communication with Friends and Family: Not on file  . Frequency of Social Gatherings with Friends and Family: Not on file  . Attends Religious Services: Not on file  . Active Member of Clubs or Organizations: Not on file  . Attends Banker Meetings: Not on file  . Marital Status: Not on file  Intimate Partner Violence:   . Fear of Current or Ex-Partner: Not on file  . Emotionally Abused: Not on file  . Physically Abused: Not on file  . Sexually Abused: Not on file     Physical Exam   Vitals:   07/24/19 0912 07/24/19 1135  BP: 126/83 111/78  Pulse: 76 83  Resp: 16 12  Temp: 97.8 F (36.6 C)  SpO2: 100% 100%    CONSTITUTIONAL: Well-appearing, NAD NEURO:  Alert and oriented x 3, normal and symmetric strength and sensation, normal coordination, normal speech EYES:  eyes equal and reactive ENT/NECK:  no LAD, no JVD CARDIO: Regular rate, well-perfused, normal S1 and S2 PULM:  CTAB no wheezing or rhonchi GI/GU:  normal bowel sounds, non-distended, non-tender MSK/SPINE:  No gross deformities, no edema SKIN:  no rash, atraumatic PSYCH:  Appropriate speech and behavior  *Additional and/or pertinent findings included in MDM below  Diagnostic and Interventional Summary    EKG Interpretation  Date/Time:  Friday July 24 2019 11:22:31 EST Ventricular Rate:  62 PR Interval:    QRS Duration: 80 QT Interval:  382 QTC Calculation: 388 R Axis:   77 Text  Interpretation: Sinus rhythm Right atrial enlargement RSR' in V1 or V2, probably normal variant Confirmed by Gerlene Fee [phone]) on 07/24/2019 11:40:54 AM      Cardiac Monitoring Interpretation:  Labs Reviewed  CBG MONITORING, ED - Abnormal; Notable for the following components:      Result Value   Glucose-Capillary 64 (*)    All other components within normal limits  PREGNANCY, URINE    No orders to display    Medications - No data to display   Procedures  /  Critical Care Procedures  ED Course and Medical Decision Making  I have reviewed the triage vital signs, the nursing notes, and pertinent available records from the EMR.  Pertinent labs & imaging results that were available during my care of the patient were reviewed by me and considered in my medical decision making (see below for details).     Mixed picture peripheral vertigo versus orthostatic hypotension.  Reassuring neurological exam, normal vital signs, EKG without acute concerns.  No indication of CNS process.  Will screen for pregnancy, blood sugar, discharge with instructions to increase water intake.  12:30 PM update: Work-up reassuring, appropriate for discharge.  Barth Kirks. Sedonia Small, MD [place] [email or web address]  Final Clinical Impressions(s) / ED Diagnoses     ICD-10-CM   1. Dizziness  R42     ED Discharge Orders         Ordered    meclizine (ANTIVERT) 25 MG tablet  3 times daily PRN     07/24/19 1232           Discharge Instructions Discussed with and Provided to Patient:     Discharge Instructions     You were evaluated in the Emergency Department and after careful evaluation, we did not find any emergent condition requiring admission or further testing in the hospital.  Your exam/testing today was overall reassuring.  Your pregnancy test was negative.  Your blood sugar was normal.  Your EKG was reassuring.  We recommend increased water  intake for the next few days to see if this helps her symptoms.  You can also try the meclizine medication provided as needed for dizziness.  Please return to the Emergency Department if you experience any worsening of your condition.  We encourage you to follow up with a primary care provider.  Thank you for allowing [place] to be a part of your care.        Maudie Flakes, MD 07/24/19 1233

## 2019-07-24 NOTE — ED Triage Notes (Signed)
Patient c/o dizziness since last night after getting off of work last night. Patient states she was getting off of her sofa and noticed it. Patient states she has dizziness on the "top of her head."

## 2019-07-24 NOTE — Discharge Instructions (Addendum)
You were evaluated in the Emergency Department and after careful evaluation, we did not find any emergent condition requiring admission or further testing in the hospital.  Your exam/testing today was overall reassuring.  Your pregnancy test was negative.  Your blood sugar was normal.  Your EKG was reassuring.  We recommend increased water intake for the next few days to see if this helps her symptoms.  You can also try the meclizine medication provided as needed for dizziness.  Please return to the Emergency Department if you experience any worsening of your condition.  We encourage you to follow up with a primary care provider.  Thank you for allowing [place] to be a part of your care.

## 2020-02-16 ENCOUNTER — Other Ambulatory Visit: Payer: Medicaid Other

## 2020-02-16 DIAGNOSIS — Z20822 Contact with and (suspected) exposure to covid-19: Secondary | ICD-10-CM

## 2020-02-17 LAB — SARS-COV-2, NAA 2 DAY TAT

## 2020-02-17 LAB — NOVEL CORONAVIRUS, NAA: SARS-CoV-2, NAA: NOT DETECTED

## 2020-03-01 ENCOUNTER — Encounter (HOSPITAL_COMMUNITY): Payer: Self-pay

## 2020-03-01 ENCOUNTER — Emergency Department (HOSPITAL_COMMUNITY): Payer: Self-pay

## 2020-03-01 ENCOUNTER — Other Ambulatory Visit: Payer: Self-pay

## 2020-03-01 ENCOUNTER — Emergency Department (HOSPITAL_COMMUNITY)
Admission: EM | Admit: 2020-03-01 | Discharge: 2020-03-01 | Disposition: A | Discharge status: Home / Self Care | Payer: Self-pay | Attending: Emergency Medicine | Admitting: Emergency Medicine

## 2020-03-01 DIAGNOSIS — S99921A Unspecified injury of right foot, initial encounter: Secondary | ICD-10-CM

## 2020-03-01 DIAGNOSIS — W010XXA Fall on same level from slipping, tripping and stumbling without subsequent striking against object, initial encounter: Secondary | ICD-10-CM | POA: Insufficient documentation

## 2020-03-01 DIAGNOSIS — Z87891 Personal history of nicotine dependence: Secondary | ICD-10-CM | POA: Insufficient documentation

## 2020-03-01 DIAGNOSIS — S93601A Unspecified sprain of right foot, initial encounter: Secondary | ICD-10-CM | POA: Insufficient documentation

## 2020-03-01 NOTE — ED Provider Notes (Signed)
[facility]-EMERGENCY DEPT Provider Note   CSN: [id number] Arrival date & time: 03/01/20  1815     History Chief Complaint  Patient presents with  . Fall  . Foot Injury    Tammie Webster is a 34 y.o. female.  HPI Patient presents with patient presents with right foot injury.  States she stepped on her mother's dog and injured her foot.  Pain on the lateral aspect of the foot.  No other injury.  States she has not been able to walk on it.  Has not seen an orthopedic surgeon before.  States she will need a work note.    Past Medical History:  Diagnosis Date  . Chlamydia   . Trichomonas infection     Patient Active Problem List   Diagnosis Date Noted  . TOBACCO USER 05/23/2009  . MIGRAINE, UNSPEC., W/O INTRACTABLE MIGRAINE 07/18/2006  . GASTROESOPHAGEAL REFLUX, NO ESOPHAGITIS 07/18/2006    History reviewed. No pertinent surgical history.   OB History   No obstetric history on file.     Family History  Problem Relation Age of Onset  . Sickle cell anemia Mother   . Hypertension Mother   . Hypertension Father     Social History   Tobacco Use  . Smoking status: Former Smoker    Packs/day: 1.00    Types: Cigarettes  . Smokeless tobacco: Never Used  Vaping Use  . Vaping Use: Never used  Substance Use Topics  . Alcohol use: No  . Drug use: No    Home Medications Prior to Admission medications   Medication Sig Start Date End Date Taking? Authorizing Provider  meclizine (ANTIVERT) 25 MG tablet Take 1 tablet (25 mg total) by mouth 3 (three) times daily as needed for dizziness. 07/24/19   Sabas Sous, MD    Allergies    Shellfish allergy  Review of Systems   Review of Systems  Constitutional: Negative for appetite change.  Respiratory: Negative for shortness of breath.   Musculoskeletal:       Right foot injury and swelling.  Skin: Negative for rash.  Neurological: Negative for weakness and numbness.    Physical Exam Updated  Vital Signs BP 119/83 (BP Location: Left Arm)   Pulse 81   Temp 99 F (37.2 C) (Oral)   Resp 18   Ht 5\' 1"  (1.549 m)   Wt 53.5 kg   LMP 02/16/2020   SpO2 100%   BMI 22.30 kg/m   Physical Exam Vitals and nursing note reviewed.  HENT:     Head: Normocephalic and atraumatic.  Eyes:     Pupils: Pupils are equal, round, and reactive to light.  Abdominal:     Tenderness: There is no abdominal tenderness.  Musculoskeletal:     Cervical back: Neck supple.     Comments: Tenderness and swelling over right mid and forefoot.  Tenderness over base of fifth metatarsal.  Tenderness over toes also.  Skin intact and sensation intact.  Neurological:     Mental Status: She is alert.     ED Results / Procedures / Treatments   Labs (all labs ordered are listed, but only abnormal results are displayed) Labs Reviewed - No data to display  EKG None  Radiology DG Foot Complete Right  Addendum Date: 03/01/2020   ADDENDUM REPORT: 03/01/2020 20:49 ADDENDUM: Please disregard the dictated report. This addendum will serve as full report of the findings. CLINICAL DATA:  34 year old female with fall and pain in the  lateral aspect of the foot. COMPARISON:  Foot radiograph dated 02/02/2019. FINDINGS: There is no acute fracture or dislocation. The bones are well mineralized. No arthritic changes. There is mild hallux valgus. Mild soft tissue thickening over the medial first metatarsal head. The soft tissues are otherwise unremarkable. IMPRESSION: No acute fracture or dislocation. Electronically Signed   By: Elgie Collard M.D.   On: 03/01/2020 20:49      Procedures Procedures (including critical care time)  Medications Ordered in ED Medications - No data to display  ED Course  I have reviewed the triage vital signs and the nursing notes.  Pertinent labs & imaging results that were available during my care of the patient were reviewed by me and considered in my medical decision making (see chart  for details).    MDM Rules/Calculators/A&P                          Patient with foot injury.  X-ray shows no fracture.  Treat with postop shoe and crutches.  Orthopedic follow-up as needed.   Final Clinical Impression(s) / ED Diagnoses Final diagnoses:  Right foot injury  Sprain of right foot, initial encounter    Rx / DC Orders ED Discharge Orders    None       Benjiman Core, MD 03/02/20 0006

## 2020-03-01 NOTE — Progress Notes (Signed)
Orthopedic Tech Progress Note Patient Details:  Tammie Webster 1985-11-28 [id number]  Ortho Devices Ortho Device/Splint Location: post op shoe RLE and crutches Ortho Device/Splint Interventions: Ordered, Application, Adjustment   Post Interventions Patient Tolerated: Well Instructions Provided: Care of device   Jennye Moccasin 03/01/2020, 9:29 PM

## 2020-03-01 NOTE — ED Triage Notes (Signed)
Patient states she tripped over her mother's dog and fell injuring her right foot.

## 2020-08-12 ENCOUNTER — Encounter (HOSPITAL_COMMUNITY): Payer: Self-pay | Admitting: Emergency Medicine

## 2020-08-12 ENCOUNTER — Emergency Department (HOSPITAL_COMMUNITY)
Admission: EM | Admit: 2020-08-12 | Discharge: 2020-08-12 | Disposition: A | Discharge status: Home / Self Care | Payer: Medicaid Other | Attending: Emergency Medicine | Admitting: Emergency Medicine

## 2020-08-12 ENCOUNTER — Other Ambulatory Visit: Payer: Self-pay

## 2020-08-12 DIAGNOSIS — T148XXA Other injury of unspecified body region, initial encounter: Secondary | ICD-10-CM

## 2020-08-12 DIAGNOSIS — S29012A Strain of muscle and tendon of back wall of thorax, initial encounter: Secondary | ICD-10-CM | POA: Insufficient documentation

## 2020-08-12 DIAGNOSIS — Z87891 Personal history of nicotine dependence: Secondary | ICD-10-CM | POA: Insufficient documentation

## 2020-08-12 DIAGNOSIS — X509XXA Other and unspecified overexertion or strenuous movements or postures, initial encounter: Secondary | ICD-10-CM | POA: Insufficient documentation

## 2020-08-12 DIAGNOSIS — Y92009 Unspecified place in unspecified non-institutional (private) residence as the place of occurrence of the external cause: Secondary | ICD-10-CM | POA: Insufficient documentation

## 2020-08-12 MED ORDER — NAPROXEN 250 MG PO TABS
500.0000 mg | ORAL_TABLET | Freq: Once | ORAL | Status: AC
  Administered 2020-08-12: 500 mg via ORAL
  Filled 2020-08-12: qty 2

## 2020-08-12 MED ORDER — NAPROXEN 500 MG PO TABS: 500.0000 mg | ORAL_TABLET | Freq: Two times a day (BID) | ORAL | 0 refills | Status: AC

## 2020-08-12 MED ORDER — CYCLOBENZAPRINE HCL 10 MG PO TABS: 10.0000 mg | ORAL_TABLET | Freq: Two times a day (BID) | ORAL | 0 refills | Status: AC | PRN

## 2020-08-12 NOTE — Discharge Instructions (Addendum)
-  Prescription sent to pharmacy for naproxen.  This in anti-inflammatory medicine.  Take it with food so does not  cause upset stomach.  Do not take any additional Aleve or Motrin or ibuprofen this medicines are all similar.  -Prescription also sent for Flexeril.  This is a muscle relaxer.  As we discussed it can make you drowsy.  Do not take if you would be working or driving.  Most people will take it at home before bed.  Return to emergency department for new or worsening symptoms

## 2020-08-12 NOTE — ED Notes (Signed)
E-signature pad unavailable at time of pt discharge. This RN discussed discharge materials with pt and answered all pt questions. Pt stated understanding of discharge material. ? ?

## 2020-08-12 NOTE — ED Triage Notes (Signed)
Patient complains of back pain after moving heavy objects around her house on Monday. Patient alert, oriented, ambulatory, and in no apparent distress at this time.

## 2020-08-12 NOTE — ED Provider Notes (Signed)
[facility] EMERGENCY DEPARTMENT Provider Note   CSN: [id number] Arrival date & time: 08/12/20  1832     History Chief Complaint  Patient presents with  . Back Pain    Tammie Webster is a 35 y.o. female with noncontributory past medical history.  HPI Patient presents to emergency department today with chief complaint of back pain x 5 days.  She states she was lifting a dresser in her room with poor inform and the next day had pain in her left upper back.  Pain was intermittent and has become constant.  She describes the pain as throbbing sensation.  Pain does not radiate.  She rates pain 10 of 10 in severity.  She has tried using an over-the-counter lidocaine patch without much symptom improvement.  He works for [employer] and throughout the week has been continuing to work and [employer] heavy boxes. Denies any fever, chills, chest pain, shortness of breath, numbness, tingling, weakness, saddle anesthesia, urinary or bowel incontinence or retention. Denies any cardiac history for self or family. LMP x 3 weeks ago. Denies chance of pregnancy.    Past Medical History:  Diagnosis Date  . Chlamydia   . Trichomonas infection     Patient Active Problem List   Diagnosis Date Noted  . TOBACCO USER 05/23/2009  . MIGRAINE, UNSPEC., W/O INTRACTABLE MIGRAINE 07/18/2006  . GASTROESOPHAGEAL REFLUX, NO ESOPHAGITIS 07/18/2006    History reviewed. No pertinent surgical history.   OB History   No obstetric history on file.     Family History  Problem Relation Age of Onset  . Sickle cell anemia Mother   . Hypertension Mother   . Hypertension Father     Social History   Tobacco Use  . Smoking status: Former Smoker    Packs/day: 1.00    Types: Cigarettes  . Smokeless tobacco: Never Used  Vaping Use  . Vaping Use: Never used  Substance Use Topics  . Alcohol use: No  . Drug use: No    Home Medications Prior to Admission medications   Medication Sig Start Date End Date  Taking? Authorizing Provider  cyclobenzaprine (FLEXERIL) 10 MG tablet Take 1 tablet (10 mg total) by mouth 2 (two) times daily as needed for muscle spasms. 08/12/20  Yes Walisiewicz, Delenn Ahn E, PA-C  naproxen (NAPROSYN) 500 MG tablet Take 1 tablet (500 mg total) by mouth 2 (two) times daily for 7 days. 08/12/20 08/19/20 Yes Walisiewicz, Caroleen Hamman, PA-C  meclizine (ANTIVERT) 25 MG tablet Take 1 tablet (25 mg total) by mouth 3 (three) times daily as needed for dizziness. 07/24/19   Sabas Sous, MD    Allergies    Shellfish allergy  Review of Systems   Review of Systems All other systems are reviewed and are negative for acute change except as noted in the HPI.  Physical Exam Updated Vital Signs BP 107/71 (BP Location: Right Arm)   Pulse 80   Temp 98.4 F (36.9 C)   Resp 14   SpO2 99%   Physical Exam Vitals and nursing note reviewed.  Constitutional:      Appearance: She is well-developed. She is not ill-appearing or toxic-appearing.  HENT:     Head: Normocephalic and atraumatic.     Nose: Nose normal.  Eyes:     General: No scleral icterus.       Right eye: No discharge.        Left eye: No discharge.     Conjunctiva/sclera: Conjunctivae normal.  Neck:  Vascular: No JVD.  Cardiovascular:     Rate and Rhythm: Normal rate and regular rhythm.     Pulses: Normal pulses.     Heart sounds: Normal heart sounds.  Pulmonary:     Effort: Pulmonary effort is normal.     Breath sounds: Normal breath sounds.  Chest:     Chest wall: No tenderness.  Abdominal:     General: There is no distension.  Musculoskeletal:        General: Normal range of motion.     Cervical back: Normal range of motion.       Back:     Comments: Tender to palpation as depicted in image above.  No overlying skin changes.  No midline tenderness to cervical, thoracic or lumbar spine.  No step-off or deformity.  No crepitus.  Ambulatory with normal gait.  Skin:    General: Skin is warm and dry.      Comments: Equal tactile temperature in all extremities.  Neurological:     Mental Status: She is oriented to person, place, and time.     GCS: GCS eye subscore is 4. GCS verbal subscore is 5. GCS motor subscore is 6.     Comments: Normal strength in upper and lower extremities bilaterally including dorsiflexion and plantar flexion, strong and equal grip strength Sensation normal to light and sharp touch   Psychiatric:        Behavior: Behavior normal.     ED Results / Procedures / Treatments   Labs (all labs ordered are listed, but only abnormal results are displayed) Labs Reviewed - No data to display  EKG None  Radiology No results found.  Procedures Procedures   Medications Ordered in ED Medications  naproxen (NAPROSYN) tablet 500 mg (500 mg Oral Given 08/12/20 1930)    ED Course  I have reviewed the triage vital signs and the nursing notes.  Pertinent labs & imaging results that were available during my care of the patient were reviewed by me and considered in my medical decision making (see chart for details).    MDM Rules/Calculators/A&P                          History provided by patient with additional history obtained from chart review.    Patient with upper back pain after lifting heavy piece of furniture suggestive of muscle strain.  No neurological deficits. Not suggestive of ACS and no family history of MI. No red flags for back pain.  Given naproxen.  She denies chance of pregnancy.  Discharged home with prescription for naproxen and muscle relaxer.  Patient knows not to drive or work while taking as it can make her drowsy.  Recommend to follow-up with PCP for symptom recheck if still having pain in 1 week. Strict return precautions discussed.   Portions of this note were generated with Scientist, clinical (histocompatibility and immunogenetics). Dictation errors may occur despite best attempts at proofreading.   Final Clinical Impression(s) / ED Diagnoses Final diagnoses:  Muscle  strain    Rx / DC Orders ED Discharge Orders         Ordered    cyclobenzaprine (FLEXERIL) 10 MG tablet  2 times daily PRN        08/12/20 1918    naproxen (NAPROSYN) 500 MG tablet  2 times daily        08/12/20 1918           Walisiewicz, Caroleen Hamman,  PA-C 08/12/20 2031    Linwood Dibbles, MD 08/16/20 [phone]

## 2021-05-21 NOTE — L&D Delivery Note (Signed)
Delivery Note Immediately after AROM, at 1:02 AM a viable female was delivered via Vaginal, Spontaneous (Presentation: Left Occiput Anterior).  APGAR: 9, 9; weight pending.  After 1 minute, the cord was clamped and cut. 40 units of pitocin diluted in 1000cc LR was infused rapidly IV.  The placenta separated spontaneously and delivered via CCT and maternal pushing effort.  It was inspected and appears to be intact with a 3 VC. The IV was noted to be infiltrated, so IM pitocin given.   Anesthesia: Local Episiotomy: None Lacerations: 2nd degree Suture Repair: 2.0 chromic Est. Blood Loss (mL): 156  Mom to postpartum.  Baby to Couplet care / Skin to Skin   Patient Vitals for the past 4 hrs:  BP Pulse Height Weight  11/28/21 0147 (!) 161/93 76 -- --  11/28/21 0145 (!) 164/105 76 -- --  11/28/21 0142 (!) 168/82 74 -- --  11/28/21 0132 (!) 169/91 81 -- --  11/28/21 0128 -- -- 5\' 3"  (1.6 m) 74.8 kg  11/28/21 0111 (!) 150/92 77 -- --  11/28/21 0033 (!) 179/91 (!) 58 -- --   Pt now dx w/preeclampsia w/severe features.  Dr 01/29/22 notified, will start Mg and labetalol IV,start procardia in the am. .  Para March 11/28/2021, 1:55 AM

## 2021-06-15 LAB — OB RESULTS CONSOLE RPR: RPR: NONREACTIVE

## 2021-06-15 LAB — OB RESULTS CONSOLE GC/CHLAMYDIA
Chlamydia: NEGATIVE
Neisseria Gonorrhea: NEGATIVE

## 2021-06-15 LAB — OB RESULTS CONSOLE ABO/RH: RH Type: POSITIVE

## 2021-06-15 LAB — OB RESULTS CONSOLE RUBELLA ANTIBODY, IGM: Rubella: IMMUNE

## 2021-06-15 LAB — OB RESULTS CONSOLE HEPATITIS B SURFACE ANTIGEN: Hepatitis B Surface Ag: NEGATIVE

## 2021-06-15 LAB — OB RESULTS CONSOLE HIV ANTIBODY (ROUTINE TESTING): HIV: NONREACTIVE

## 2021-06-15 LAB — OB RESULTS CONSOLE ANTIBODY SCREEN: Antibody Screen: NEGATIVE

## 2021-06-15 LAB — OB RESULTS CONSOLE VARICELLA ZOSTER ANTIBODY, IGG: Varicella: IMMUNE

## 2021-06-15 LAB — HEPATITIS C ANTIBODY: HCV Ab: NEGATIVE

## 2021-06-16 ENCOUNTER — Other Ambulatory Visit: Payer: Self-pay | Admitting: Family Medicine

## 2021-06-16 DIAGNOSIS — Z363 Encounter for antenatal screening for malformations: Secondary | ICD-10-CM

## 2021-07-05 ENCOUNTER — Encounter: Payer: Self-pay | Admitting: *Deleted

## 2021-07-10 ENCOUNTER — Encounter: Payer: Self-pay | Admitting: *Deleted

## 2021-07-10 ENCOUNTER — Ambulatory Visit: Payer: Medicaid Other | Admitting: *Deleted

## 2021-07-10 ENCOUNTER — Other Ambulatory Visit: Payer: Self-pay | Admitting: *Deleted

## 2021-07-10 ENCOUNTER — Ambulatory Visit: Payer: Medicaid Other

## 2021-07-10 ENCOUNTER — Other Ambulatory Visit: Payer: Self-pay

## 2021-07-10 ENCOUNTER — Ambulatory Visit: Payer: Medicaid Other | Attending: Family Medicine

## 2021-07-10 VITALS — BP 102/64 | HR 86

## 2021-07-10 DIAGNOSIS — O09522 Supervision of elderly multigravida, second trimester: Secondary | ICD-10-CM

## 2021-07-10 DIAGNOSIS — Z363 Encounter for antenatal screening for malformations: Secondary | ICD-10-CM | POA: Diagnosis present

## 2021-07-10 DIAGNOSIS — Z362 Encounter for other antenatal screening follow-up: Secondary | ICD-10-CM

## 2021-07-10 NOTE — Progress Notes (Signed)
Name: Tammie Webster Indication: Advanced Maternal Age  DOB: 01/06/1986 Age: 36 y.o.   EDC: 12/04/2021 LMP: 02/27/2021 Referring Provider:  Bedelia Person, FNP  EGA: [redacted]w[redacted]d Genetic Counselor: Staci Righter, MS, CGC  OB [id number] Date of Appointment: 07/10/2021  Accompanied by: Kristine Linea Face to Face Time: 15 Minutes   Previous Testing Completed: Rosaida previously completed Non-Invasive Prenatal Screening (NIPS) in this pregnancy. The result is low risk, consistent with a female fetus. This screening significantly reduces the risk that the current pregnancy has Down syndrome, Trisomy 36, Trisomy 89, and common sex chromosome conditions, however, the risk is not zero given the limitations of NIPS. Additionally, there are many genetic conditions that cannot be detected by NIPS.  Jeida previously completed carrier screening. She screened to not be a carrier for Cystic Fibrosis (CF), Spinal Muscular Atrophy (SMA), alpha thalassemia, and beta hemoglobinopathies. A negative result on carrier screening reduces the likelihood of being a carrier, however, does not entirely rule out the possibility.   Medical & Family History:  This is Tammie Webster's 2nd pregnancy. She has a living, healthy son from a previous partner. Reports she takes prenatal vitamins. Denies personal history of diabetes, high blood pressure, thyroid conditions, and seizures. Denies bleeding, infections, and fevers in this pregnancy. Denies using tobacco, alcohol, or street drugs in this pregnancy.  Maternal ethnicity reported as Research scientist (physical sciences) and paternal ethnicity reported as Research scientist (physical sciences). Denies consanguinity. Jaelianna denied a family history of chromosome conditions, intellectual disability, autism, birth defects, bone/skeletal disorders, blindness, deafness, blood disorders, neuromuscular disorders, still births, early infant deaths, and 3 or more pregnancy losses for one person on her prenatal intake  questionnaire.      Genetic Counseling:   Advanced Maternal Age. With delivery at 56 years, Mycah's age-related risk to have a baby with Down syndrome is 1/310 and risk to have a baby with any chromosome condition is 1/141. Gracy had a low-risk NIPS in this pregnancy. This significantly reduces the risk that the current pregnancy has Down syndrome, Trisomy 71, Trisomy 42, or common sex chromosome aneuploidies. A low-risk result does not ensure an unaffected pregnancy. NIPS does not screen for neural tube defects or other genetic conditions.  Birth Defects. All babies have approximately a 3-5% risk for a birth defect and a majority of these defects cannot be detected through the screening or diagnostic testing listed below. Ultrasound may detect some birth defects, but it may not detect all birth defects. About half of pregnancies with Down syndrome do not show any soft markers on ultrasound. A normal ultrasound does not guarantee a healthy pregnancy.   Testing/Screening Options:   Amniocentesis. This procedure is available for prenatal diagnosis. Possible procedural difficulties and complications that can arise include maternal infection, cramping, bleeding, fluid leakage, and/or pregnancy loss. The risk for pregnancy loss with an amniocentesis is 1/500. Per the [employer] (ACOG) Practice Bulletin 162, all pregnant women should be offered prenatal assessment for aneuploidy by diagnostic testing regardless of maternal age or other risk factors. If indicated, genetic testing that could be ordered on an amniocentesis sample includes a fetal karyotype, fetal microarray, and testing for specific syndromes.     Patient Plan:  Proceed with: Routine prenatal care Informed consent was obtained. All questions were answered.  Declined:  Amniocentesis   Thank you for sharing in the care of [facility] with [place].  Please do not hesitate to contact us if you have any  questions.  Staci Righter, MS, [place] Certified Genetic  Counselor

## 2021-07-17 ENCOUNTER — Inpatient Hospital Stay (HOSPITAL_COMMUNITY)
Admission: AD | Admit: 2021-07-17 | Discharge: 2021-07-17 | Disposition: A | Discharge status: Home / Self Care | Payer: Medicaid Other | Attending: Family Medicine | Admitting: Family Medicine

## 2021-07-17 DIAGNOSIS — J029 Acute pharyngitis, unspecified: Secondary | ICD-10-CM | POA: Diagnosis not present

## 2021-07-17 DIAGNOSIS — O26892 Other specified pregnancy related conditions, second trimester: Secondary | ICD-10-CM | POA: Insufficient documentation

## 2021-07-17 DIAGNOSIS — Z3A20 20 weeks gestation of pregnancy: Secondary | ICD-10-CM

## 2021-07-17 NOTE — MAU Provider Note (Signed)
°  History     CSN: [id number]  Arrival date and time: 07/17/21 0805    Chief Complaint  Patient presents with   Sore Throat   HPI This is a 36 year old G2 P1-0-0-1 at 20 weeks who presents with sore throat that started last night.  Patient says that it feels like she has an itch in her throat and her symptoms are fairly mild..  She has not tried anything for it.  She has been drinking plenty of water and using a humidifier at home.  She has had a couple of deaths in the family and has been at family gatherings.  No fevers, chills, nausea, vomiting, cough.  She denies nasal congestion.  OB History     Gravida  2   Para  1   Term  1   Preterm      AB      Living  1      SAB      IAB      Ectopic      Multiple      Live Births  1           Past Medical History:  Diagnosis Date   Chlamydia    Trichomonas infection     Past Surgical History:  Procedure Laterality Date   NO PAST SURGERIES      Family History  Problem Relation Age of Onset   Sickle cell anemia Mother    Hypertension Mother    Hypertension Father     Social History   Tobacco Use   Smoking status: Former    Packs/day: 1.00    Types: Cigarettes   Smokeless tobacco: Never  Vaping Use   Vaping Use: Never used  Substance Use Topics   Alcohol use: No   Drug use: No    Allergies:  Allergies  Allergen Reactions   Shellfish Allergy Anaphylaxis    Medications Prior to Admission  Medication Sig Dispense Refill Last Dose   cyclobenzaprine (FLEXERIL) 10 MG tablet Take 1 tablet (10 mg total) by mouth 2 (two) times daily as needed for muscle spasms. 10 tablet 0    meclizine (ANTIVERT) 25 MG tablet Take 1 tablet (25 mg total) by mouth 3 (three) times daily as needed for dizziness. 30 tablet 0    Prenatal Vit-Fe Fumarate-FA (PRENATAL MULTIVITAMIN) TABS tablet Take 1 tablet by mouth daily at 12 noon.       Review of Systems Physical Exam   Blood pressure (!) 102/54, pulse 85,  temperature 98.1 F (36.7 C), resp. rate 18, height 5\' 3"  (1.6 m), weight 61.7 kg, last menstrual period 02/27/2021.  Physical Exam Constitutional:      Appearance: She is well-developed.  HENT:     Head: Normocephalic and atraumatic.     Mouth/Throat:     Mouth: Mucous membranes are moist. No oral lesions.     Pharynx: Uvula midline. No oropharyngeal exudate or uvula swelling.     Tonsils: No tonsillar exudate or tonsillar abscesses.  Eyes:     Conjunctiva/sclera: Conjunctivae normal.  Neurological:     Mental Status: She is alert.    MAU Course  Procedures  MDM  Assessment and Plan   1. [redacted] weeks gestation of pregnancy   2. Sore throat    Symptomatic treatment with fluids, rest, throat lozenges.  04/29/2021 07/17/2021, 8:32 AM

## 2021-07-17 NOTE — MAU Note (Signed)
Dr. Adrian Blackwater MSE . No further eval needed. Pt d/c'd home.

## 2021-07-17 NOTE — MAU Note (Signed)
Pt stated started having scratchy throat last night. Denies andy cough, nasal congestion or fever.

## 2021-08-07 ENCOUNTER — Ambulatory Visit: Payer: Medicaid Other | Admitting: *Deleted

## 2021-08-07 ENCOUNTER — Ambulatory Visit: Payer: Medicaid Other | Attending: Obstetrics and Gynecology

## 2021-08-07 ENCOUNTER — Other Ambulatory Visit: Payer: Self-pay

## 2021-08-07 ENCOUNTER — Other Ambulatory Visit: Payer: Self-pay | Admitting: *Deleted

## 2021-08-07 ENCOUNTER — Encounter: Payer: Self-pay | Admitting: *Deleted

## 2021-08-07 VITALS — BP 100/60 | HR 74

## 2021-08-07 DIAGNOSIS — O09522 Supervision of elderly multigravida, second trimester: Secondary | ICD-10-CM

## 2021-08-07 DIAGNOSIS — Z3A23 23 weeks gestation of pregnancy: Secondary | ICD-10-CM

## 2021-08-07 DIAGNOSIS — Z362 Encounter for other antenatal screening follow-up: Secondary | ICD-10-CM

## 2021-10-09 ENCOUNTER — Ambulatory Visit: Payer: Medicaid Other | Admitting: *Deleted

## 2021-10-09 ENCOUNTER — Ambulatory Visit: Payer: Medicaid Other | Attending: Obstetrics

## 2021-10-09 VITALS — BP 105/60 | HR 71

## 2021-10-09 DIAGNOSIS — O09523 Supervision of elderly multigravida, third trimester: Secondary | ICD-10-CM | POA: Diagnosis not present

## 2021-10-09 DIAGNOSIS — Z362 Encounter for other antenatal screening follow-up: Secondary | ICD-10-CM

## 2021-10-09 DIAGNOSIS — O09522 Supervision of elderly multigravida, second trimester: Secondary | ICD-10-CM | POA: Insufficient documentation

## 2021-10-09 DIAGNOSIS — Z3A32 32 weeks gestation of pregnancy: Secondary | ICD-10-CM | POA: Diagnosis not present

## 2021-10-12 ENCOUNTER — Inpatient Hospital Stay (HOSPITAL_COMMUNITY)
Admission: AD | Admit: 2021-10-12 | Discharge: 2021-10-12 | Disposition: A | Discharge status: Home / Self Care | Payer: Medicaid Other | Attending: Obstetrics and Gynecology | Admitting: Obstetrics and Gynecology

## 2021-10-12 ENCOUNTER — Encounter (HOSPITAL_COMMUNITY): Payer: Self-pay | Admitting: Obstetrics and Gynecology

## 2021-10-12 ENCOUNTER — Inpatient Hospital Stay (HOSPITAL_BASED_OUTPATIENT_CLINIC_OR_DEPARTMENT_OTHER): Payer: Medicaid Other

## 2021-10-12 DIAGNOSIS — Z3A32 32 weeks gestation of pregnancy: Secondary | ICD-10-CM

## 2021-10-12 DIAGNOSIS — R109 Unspecified abdominal pain: Secondary | ICD-10-CM | POA: Diagnosis not present

## 2021-10-12 DIAGNOSIS — O26893 Other specified pregnancy related conditions, third trimester: Secondary | ICD-10-CM | POA: Diagnosis not present

## 2021-10-12 DIAGNOSIS — R110 Nausea: Secondary | ICD-10-CM | POA: Diagnosis not present

## 2021-10-12 LAB — COMPREHENSIVE METABOLIC PANEL
ALT: 24 U/L (ref 0–44)
AST: 23 U/L (ref 15–41)
Albumin: 2.9 g/dL — ABNORMAL LOW (ref 3.5–5.0)
Alkaline Phosphatase: 119 U/L (ref 38–126)
Anion gap: 10 (ref 5–15)
BUN: 6 mg/dL (ref 6–20)
CO2: 21 mmol/L — ABNORMAL LOW (ref 22–32)
Calcium: 8.7 mg/dL — ABNORMAL LOW (ref 8.9–10.3)
Chloride: 106 mmol/L (ref 98–111)
Creatinine, Ser: 0.76 mg/dL (ref 0.44–1.00)
GFR, Estimated: >60 mL/min (ref >60)
Glucose, Bld: 107 mg/dL — ABNORMAL HIGH (ref 70–99)
Potassium: 3.8 mmol/L (ref 3.5–5.1)
Sodium: 137 mmol/L (ref 135–145)
Total Bilirubin: 0.6 mg/dL (ref 0.3–1.2)
Total Protein: 6.9 g/dL (ref 6.5–8.1)

## 2021-10-12 LAB — CBC
HCT: 32.3 % — ABNORMAL LOW (ref 36.0–46.0)
Hemoglobin: 10.2 g/dL — ABNORMAL LOW (ref 12.0–15.0)
MCH: 28.3 pg (ref 26.0–34.0)
MCHC: 31.6 g/dL (ref 30.0–36.0)
MCV: 89.7 fL (ref 80.0–100.0)
Platelets: 303 10*3/uL (ref 150–400)
RBC: 3.6 MIL/uL — ABNORMAL LOW (ref 3.87–5.11)
RDW: 11.9 % (ref 11.5–15.5)
WBC: 6.3 10*3/uL (ref 4.0–10.5)
nRBC: 0 % (ref 0.0–0.2)

## 2021-10-12 LAB — URINALYSIS, ROUTINE W REFLEX MICROSCOPIC
Bilirubin Urine: NEGATIVE
Glucose, UA: NEGATIVE mg/dL
Hgb urine dipstick: NEGATIVE
Ketones, ur: NEGATIVE mg/dL
Nitrite: NEGATIVE
Protein, ur: NEGATIVE mg/dL
Specific Gravity, Urine: 1.014 (ref 1.005–1.030)
pH: 7 (ref 5.0–8.0)

## 2021-10-12 LAB — LIPASE, BLOOD: Lipase: 48 U/L (ref 11–51)

## 2021-10-12 MED ORDER — ACETAMINOPHEN 500 MG PO TABS
1000.0000 mg | ORAL_TABLET | Freq: Once | ORAL | Status: AC
  Administered 2021-10-12: 1000 mg via ORAL
  Filled 2021-10-12: qty 2

## 2021-10-12 MED ORDER — OXYCODONE HCL 5 MG PO TABS
5.0000 mg | ORAL_TABLET | Freq: Once | ORAL | Status: AC
  Administered 2021-10-12: 5 mg via ORAL
  Filled 2021-10-12: qty 1

## 2021-10-12 MED ORDER — LACTATED RINGERS IV BOLUS
1000.0000 mL | Freq: Once | INTRAVENOUS | Status: AC
  Administered 2021-10-12: 1000 mL via INTRAVENOUS

## 2021-10-12 NOTE — Discharge Instructions (Signed)
You were seen in the MAU for abdominal pain. We are not sure what caused it but we did not find any dangerous causes of abdominal pain. Your cervix is not dilating, and your blood and urine labs were normal. If you develop a fever, abdominal pain begins to worsen again, have heavy vaginal bleeding, leaking fluid from your vagina, your baby is not moving like normal, or you have regular contractions, return to the MAU immediately.

## 2021-10-12 NOTE — MAU Provider Note (Signed)
History     [id number]  Arrival date and time: 10/12/21 4098    Chief Complaint  Patient presents with   Abdominal Pain     HPI Tammie Webster is a 36 y.o. 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   Review of outside prenatal records from [facility] Department: unremarkable labs to date, no GDM. UMT positive for tobacco and MJ only.   Patient reports that around 0500 she had sudden onset diffuse abdominal pain She has not had something similar before It woke her out of her sleep Her last delivery was 13 years ago so not sure if it is like contractions, but pain is constant, not coming and going It is diffuse over her abdomen, no one area is more painful than the other She did vomit one time and feels very hot No diarrhea No vaginal itching or discharge No burning or pain with urination No back pain Does not take any medicines besides prenatals No vaginal bleeding or loss of fluid Endorses vigorous fetal movement No sick contacts or new foods No prior abdominal surgeries No sore throat No constipation     OB History     Gravida  2   Para  1   Term  1   Preterm      AB      Living  1      SAB      IAB      Ectopic      Multiple      Live Births  1           Past Medical History:  Diagnosis Date   Chlamydia    Trichomonas infection     Past Surgical History:  Procedure Laterality Date   NO PAST SURGERIES      Family History  Problem Relation Age of Onset   Sickle cell anemia Mother    Hypertension Mother    Hypertension Father     Social History   Socioeconomic History   Marital status: Single    Spouse name: Not on file   Number of children: Not on file   Years of education: Not on file   Highest education level: Not on file  Occupational History   Not on file  Tobacco Use   Smoking status: Former    Packs/day: 1.00    Types: Cigarettes    Smokeless tobacco: Never  Vaping Use   Vaping Use: Never used  Substance and Sexual Activity   Alcohol use: No   Drug use: No   Sexual activity: Not Currently    Birth control/protection: Injection  Other Topics Concern   Not on file  Social History Narrative   Not on file   Social Determinants of Health   Financial Resource Strain: Not on file  Food Insecurity: Not on file  Transportation Needs: Not on file  Physical Activity: Not on file  Stress: Not on file  Social Connections: Not on file  Intimate Partner Violence: Not on file    Allergies  Allergen Reactions   Shellfish Allergy Anaphylaxis    No current facility-administered medications on file prior to encounter.   Current Outpatient Medications on File Prior to Encounter  Medication Sig Dispense Refill   cyclobenzaprine (FLEXERIL) 10 MG tablet Take 1 tablet (10 mg total) by mouth 2 (two) times daily as needed for muscle spasms. 10 tablet 0   meclizine (  ANTIVERT) 25 MG tablet Take 1 tablet (25 mg total) by mouth 3 (three) times daily as needed for dizziness. 30 tablet 0   Prenatal Vit-Fe Fumarate-FA (PRENATAL MULTIVITAMIN) TABS tablet Take 1 tablet by mouth daily at 12 noon.       ROS Pertinent positives and negative per HPI, all others reviewed and negative  Physical Exam   BP 117/71 (BP Location: Right Arm)   Pulse 72   Temp 98.2 F (36.8 C) (Oral)   Resp 18   Ht 5\' 3"  (1.6 m)   Wt 66.9 kg   LMP 02/27/2021   SpO2 100%   BMI 26.13 kg/m   Patient Vitals for the past 24 hrs:  BP Temp Temp src Pulse Resp SpO2 Height Weight  10/12/21 0951 117/71 -- -- 72 -- -- -- --  10/12/21 0925 -- -- -- -- -- 100 % -- --  10/12/21 0920 -- -- -- -- -- 100 % -- --  10/12/21 0915 -- -- -- -- -- 100 % -- --  10/12/21 0910 -- -- -- -- -- (!) 73 % -- --  10/12/21 0905 -- -- -- -- -- 100 % -- --  10/12/21 0855 126/80 -- -- 72 18 100 % -- --  10/12/21 0850 -- -- -- -- -- 100 % -- --  10/12/21 0845 -- -- -- -- -- 98 %  -- --  10/12/21 0840 -- -- -- -- -- 100 % -- --  10/12/21 0835 -- -- -- -- -- 99 % -- --  10/12/21 0820 -- -- -- -- -- 100 % -- --  10/12/21 0803 108/60 -- -- -- 19 -- -- --  10/12/21 0755 108/60 -- -- 64 -- -- -- --  10/12/21 0746 -- -- -- -- -- -- 5\' 3"  (1.6 m) 66.9 kg  10/12/21 0740 (!) 103/56 98.2 F (36.8 C) Oral 82 (!) 21 100 % -- --    Physical Exam Constitutional:      Appearance: She is well-developed.     Comments: Appears uncomfortable  HENT:     Head: Normocephalic and atraumatic.  Eyes:     General: No scleral icterus. Cardiovascular:     Rate and Rhythm: Normal rate and regular rhythm.  Pulmonary:     Effort: Pulmonary effort is normal. No respiratory distress.  Abdominal:     Palpations: Abdomen is soft.     Tenderness: There is generalized abdominal tenderness. There is no guarding or rebound.     Comments: gravid  Skin:    General: Skin is warm and dry.  Neurological:     General: No focal deficit present.     Mental Status: She is alert.  Psychiatric:        Mood and Affect: Mood normal.        Behavior: Behavior normal.     Cervical Exam Dilation: 1 Effacement (%): Thick Cervical Position: Posterior Station: -2 Presentation: Vertex Exam by:: 10/14/21.  Bedside Ultrasound Not done  My interpretation: n/a  FHT Baseline 140, moderate variability, +accels, no decels Toco: irritability Cat: I  Labs Results for orders placed or performed during the hospital encounter of 10/12/21 (from the past 24 hour(s))  Urinalysis, Routine w reflex microscopic     Status: Abnormal   Collection Time: 10/12/21  8:33 AM  Result Value Ref Range   Color, Urine YELLOW YELLOW   APPearance CLEAR CLEAR   Specific Gravity, Urine 1.014 1.005 - 1.030   pH 7.0 5.0 - 8.0  Glucose, UA NEGATIVE NEGATIVE mg/dL   Hgb urine dipstick NEGATIVE NEGATIVE   Bilirubin Urine NEGATIVE NEGATIVE   Ketones, ur NEGATIVE NEGATIVE mg/dL   Protein, ur NEGATIVE NEGATIVE  mg/dL   Nitrite NEGATIVE NEGATIVE   Leukocytes,Ua TRACE (A) NEGATIVE   RBC / HPF 0-5 0 - 5 RBC/hpf   WBC, UA 0-5 0 - 5 WBC/hpf   Bacteria, UA RARE (A) NONE SEEN   Squamous Epithelial / LPF 0-5 0 - 5  CBC     Status: Abnormal   Collection Time: 10/12/21  8:44 AM  Result Value Ref Range   WBC 6.3 4.0 - 10.5 K/uL   RBC 3.60 (L) 3.87 - 5.11 MIL/uL   Hemoglobin 10.2 (L) 12.0 - 15.0 g/dL   HCT 69.6 (L) 29.5 - 28.4 %   MCV 89.7 80.0 - 100.0 fL   MCH 28.3 26.0 - 34.0 pg   MCHC 31.6 30.0 - 36.0 g/dL   RDW 13.2 44.0 - 10.2 %   Platelets 303 150 - 400 K/uL   nRBC 0.0 0.0 - 0.2 %  Comprehensive metabolic panel     Status: Abnormal   Collection Time: 10/12/21  8:44 AM  Result Value Ref Range   Sodium 137 135 - 145 mmol/L   Potassium 3.8 3.5 - 5.1 mmol/L   Chloride 106 98 - 111 mmol/L   CO2 21 (L) 22 - 32 mmol/L   Glucose, Bld 107 (H) 70 - 99 mg/dL   BUN 6 6 - 20 mg/dL   Creatinine, Ser 7.25 0.44 - 1.00 mg/dL   Calcium 8.7 (L) 8.9 - 10.3 mg/dL   Total Protein 6.9 6.5 - 8.1 g/dL   Albumin 2.9 (L) 3.5 - 5.0 g/dL   AST 23 15 - 41 U/L   ALT 24 0 - 44 U/L   Alkaline Phosphatase 119 38 - 126 U/L   Total Bilirubin 0.6 0.3 - 1.2 mg/dL   GFR, Estimated >36 >64 mL/min   Anion gap 10 5 - 15  Lipase, blood     Status: None   Collection Time: 10/12/21  8:44 AM  Result Value Ref Range   Lipase 48 11 - 51 U/L    Imaging No results found.  MAU Course  Procedures Lab Orders         Urinalysis, Routine w reflex microscopic         CBC         Comprehensive metabolic panel         Lactic acid, plasma         Lipase, blood     Meds ordered this encounter  Medications   lactated ringers bolus 1,000 mL   acetaminophen (TYLENOL) tablet 1,000 mg   oxyCODONE (Oxy IR/ROXICODONE) immediate release tablet 5 mg   Imaging Orders         [place] MFM OB LIMITED     MDM moderate  Assessment and Plan  #Abdominal pain in pregnancy, third trimester #[redacted] weeks gestation of pregnancy Unclear etiology  of pain at present time. Pain is diffuse and non-localizing, only other associated symptom is nausea. Cervix very difficult to exam due to patient discomfort, but still posterior and only about 1 cm, so lower suspicion for preterm labor at this point. She reports her last child was born term. If other interventions are not successful though will trial procardia.  Abruption is another consideration although no bleeding or fetal distress at present to support the diagnosis, UMT at health department positive only  for tobacco and MJ and no history in chart of substance use. Rupture possible but very unlikely. Ovarian cyst/rupture another possibility. Will obtain limited OB US to assess these various possibilities.  Infectious etiology more common and therefore more likely. Viral gastroenteritis is a possibility. Group A strep can also cause abdominal pain though unlikely in this age range. Would be diagnosis of exclusion.   Nothing localizing to suggest appendicitis, cholecystitis, diverticulitis, etc.   Will do basic labs (CBC/CMP) as well as lactic acid, give 1L LR bolus, tylenol and oxycodone, and reassess. May need to pursue imaging pending clinical course.  8:42 AM  On re-evaluation patient's pain is significantly improved, down from 8/10 to 3/10. Has been able to take some PO without issue. VS unremarkable. CMP, lipase, and CBC also unremarkable. Preliminary US report without any notable findings. Cervix unchanged on serial exams. Uterine irritability much improved after IVF. Discussed with patient that I am not totally sure what has caused her pain but I can not find any acute causes at present. Does not appear to be in preterm labor. Given lack of acute findings I am comfortable with her being discharged to home with strict return precautions if pain again worsens, she develops a fever, or routine indications (SROM, vaginal bleeding, DFM, etc.). Patient is in agreement with this plan.   10:49  AM   #FWB FHT Cat I NST: Reactive   Dispo: discharged to home in stable condition.   Venora MaplesMatthew M Giulia Hickey, MD/MPH 10/12/21 10:49 AM  Allergies as of 10/12/2021       Reactions   Shellfish Allergy Anaphylaxis        Medication List     TAKE these medications    cyclobenzaprine 10 MG tablet Commonly known as: FLEXERIL Take 1 tablet (10 mg total) by mouth 2 (two) times daily as needed for muscle spasms.   meclizine 25 MG tablet Commonly known as: ANTIVERT Take 1 tablet (25 mg total) by mouth 3 (three) times daily as needed for dizziness.   prenatal multivitamin Tabs tablet Take 1 tablet by mouth daily at 12 noon.

## 2021-10-12 NOTE — MAU Note (Signed)
...  Tammie Webster is a 36 y.o. at [redacted]w[redacted]d here in MAU reporting: Severe abdominal pain since 0500 this morning that is constant, tight, and cramping. She reports she is unable to get any relief in any position and nothing makes it worse. She reports she has also been hot and cannot seem to cool off. She denies vaginal bleeding and LOF. +FM.   Pain score:  8/10 generalized abdomen  FHT: 144 initial external Lab orders placed from triage: UA

## 2021-10-12 NOTE — Progress Notes (Signed)
Taken off the monitor, being discharged.

## 2021-11-09 LAB — OB RESULTS CONSOLE GBS: GBS: NEGATIVE

## 2021-11-28 ENCOUNTER — Inpatient Hospital Stay (HOSPITAL_COMMUNITY)
Admission: AD | Admit: 2021-11-28 | Discharge: 2021-11-30 | DRG: 807 | Disposition: A | Discharge status: Home / Self Care | Payer: Medicaid Other | Attending: Obstetrics and Gynecology | Admitting: Obstetrics and Gynecology

## 2021-11-28 ENCOUNTER — Encounter (HOSPITAL_COMMUNITY): Payer: Self-pay | Admitting: Obstetrics and Gynecology

## 2021-11-28 ENCOUNTER — Other Ambulatory Visit: Payer: Self-pay

## 2021-11-28 DIAGNOSIS — O26893 Other specified pregnancy related conditions, third trimester: Secondary | ICD-10-CM | POA: Diagnosis present

## 2021-11-28 DIAGNOSIS — Z87891 Personal history of nicotine dependence: Secondary | ICD-10-CM | POA: Diagnosis not present

## 2021-11-28 DIAGNOSIS — O1495 Unspecified pre-eclampsia, complicating the puerperium: Secondary | ICD-10-CM | POA: Diagnosis present

## 2021-11-28 DIAGNOSIS — Z3A39 39 weeks gestation of pregnancy: Secondary | ICD-10-CM

## 2021-11-28 DIAGNOSIS — O1415 Severe pre-eclampsia, complicating the puerperium: Secondary | ICD-10-CM | POA: Diagnosis not present

## 2021-11-28 LAB — CBC
HCT: 32.4 % — ABNORMAL LOW (ref 36.0–46.0)
Hemoglobin: 10.2 g/dL — ABNORMAL LOW (ref 12.0–15.0)
MCH: 26 pg (ref 26.0–34.0)
MCHC: 31.5 g/dL (ref 30.0–36.0)
MCV: 82.7 fL (ref 80.0–100.0)
Platelets: 312 10*3/uL (ref 150–400)
RBC: 3.92 MIL/uL (ref 3.87–5.11)
RDW: 13.1 % (ref 11.5–15.5)
WBC: 8.1 10*3/uL (ref 4.0–10.5)
nRBC: 0.4 % — ABNORMAL HIGH (ref 0.0–0.2)

## 2021-11-28 LAB — COMPREHENSIVE METABOLIC PANEL
ALT: 14 U/L (ref 0–44)
AST: 22 U/L (ref 15–41)
Albumin: 2.8 g/dL — ABNORMAL LOW (ref 3.5–5.0)
Alkaline Phosphatase: 227 U/L — ABNORMAL HIGH (ref 38–126)
Anion gap: 13 (ref 5–15)
BUN: 9 mg/dL (ref 6–20)
CO2: 18 mmol/L — ABNORMAL LOW (ref 22–32)
Calcium: 9.1 mg/dL (ref 8.9–10.3)
Chloride: 106 mmol/L (ref 98–111)
Creatinine, Ser: 0.91 mg/dL (ref 0.44–1.00)
GFR, Estimated: >60 mL/min (ref >60)
Glucose, Bld: 96 mg/dL (ref 70–99)
Potassium: 3.3 mmol/L — ABNORMAL LOW (ref 3.5–5.1)
Sodium: 137 mmol/L (ref 135–145)
Total Bilirubin: 0.4 mg/dL (ref 0.3–1.2)
Total Protein: 6.9 g/dL (ref 6.5–8.1)

## 2021-11-28 LAB — RPR: RPR Ser Ql: NONREACTIVE

## 2021-11-28 MED ORDER — OXYTOCIN 10 UNIT/ML IJ SOLN
INTRAMUSCULAR | Status: AC
  Filled 2021-11-28: qty 1

## 2021-11-28 MED ORDER — ONDANSETRON HCL 4 MG/2ML IJ SOLN: 4.0000 mg | Freq: Four times a day (QID) | INTRAMUSCULAR | Status: DC | PRN

## 2021-11-28 MED ORDER — SIMETHICONE 80 MG PO CHEW: 80.0000 mg | CHEWABLE_TABLET | ORAL | Status: DC | PRN

## 2021-11-28 MED ORDER — LACTATED RINGERS IV SOLN: 500.0000 mL | INTRAVENOUS | Status: DC | PRN

## 2021-11-28 MED ORDER — LACTATED RINGERS IV SOLN: INTRAVENOUS | Status: DC

## 2021-11-28 MED ORDER — BISACODYL 10 MG RE SUPP: 10.0000 mg | Freq: Every day | RECTAL | Status: DC | PRN

## 2021-11-28 MED ORDER — OXYCODONE-ACETAMINOPHEN 5-325 MG PO TABS: 1.0000 | ORAL_TABLET | ORAL | Status: DC | PRN

## 2021-11-28 MED ORDER — MAGNESIUM SULFATE BOLUS VIA INFUSION
4.0000 g | Freq: Once | INTRAVENOUS | Status: AC
  Administered 2021-11-28: 4 g via INTRAVENOUS
  Filled 2021-11-28: qty 1000

## 2021-11-28 MED ORDER — LABETALOL HCL 5 MG/ML IV SOLN: 80.0000 mg | INTRAVENOUS | Status: DC | PRN

## 2021-11-28 MED ORDER — BENZOCAINE-MENTHOL 20-0.5 % EX AERO: 1.0000 | INHALATION_SPRAY | CUTANEOUS | Status: DC | PRN

## 2021-11-28 MED ORDER — NIFEDIPINE ER OSMOTIC RELEASE 30 MG PO TB24
30.0000 mg | ORAL_TABLET | Freq: Every day | ORAL | Status: DC
  Administered 2021-11-28 – 2021-11-30 (×3): 30 mg via ORAL
  Filled 2021-11-28 (×3): qty 1

## 2021-11-28 MED ORDER — DIPHENHYDRAMINE HCL 25 MG PO CAPS: 25.0000 mg | ORAL_CAPSULE | Freq: Four times a day (QID) | ORAL | Status: DC | PRN

## 2021-11-28 MED ORDER — HYDRALAZINE HCL 20 MG/ML IJ SOLN: 10.0000 mg | INTRAMUSCULAR | Status: DC | PRN

## 2021-11-28 MED ORDER — TETANUS-DIPHTH-ACELL PERTUSSIS 5-2.5-18.5 LF-MCG/0.5 IM SUSY: 0.5000 mL | PREFILLED_SYRINGE | Freq: Once | INTRAMUSCULAR | Status: DC

## 2021-11-28 MED ORDER — IBUPROFEN 600 MG PO TABS
600.0000 mg | ORAL_TABLET | Freq: Four times a day (QID) | ORAL | Status: DC
  Administered 2021-11-28 – 2021-11-30 (×9): 600 mg via ORAL
  Filled 2021-11-28 (×10): qty 1

## 2021-11-28 MED ORDER — SENNOSIDES-DOCUSATE SODIUM 8.6-50 MG PO TABS
2.0000 | ORAL_TABLET | ORAL | Status: DC
  Administered 2021-11-28 – 2021-11-30 (×2): 2 via ORAL
  Filled 2021-11-28 (×3): qty 2

## 2021-11-28 MED ORDER — OXYTOCIN-SODIUM CHLORIDE 30-0.9 UT/500ML-% IV SOLN
2.5000 [IU]/h | INTRAVENOUS | Status: DC
  Filled 2021-11-28: qty 500

## 2021-11-28 MED ORDER — OXYCODONE-ACETAMINOPHEN 5-325 MG PO TABS: 2.0000 | ORAL_TABLET | ORAL | Status: DC | PRN

## 2021-11-28 MED ORDER — FERROUS SULFATE 325 (65 FE) MG PO TABS
325.0000 mg | ORAL_TABLET | ORAL | Status: DC
  Administered 2021-11-28 – 2021-11-30 (×2): 325 mg via ORAL
  Filled 2021-11-28 (×2): qty 1

## 2021-11-28 MED ORDER — OXYTOCIN 10 UNIT/ML IJ SOLN
10.0000 [IU] | Freq: Once | INTRAMUSCULAR | Status: AC
Start: 2021-11-28 — End: 2021-11-28
  Administered 2021-11-28: 10 [IU] via INTRAMUSCULAR

## 2021-11-28 MED ORDER — METHYLERGONOVINE MALEATE 0.2 MG/ML IJ SOLN: 0.2000 mg | INTRAMUSCULAR | Status: DC | PRN

## 2021-11-28 MED ORDER — FENTANYL CITRATE (PF) 100 MCG/2ML IJ SOLN
50.0000 ug | INTRAMUSCULAR | Status: DC | PRN
  Filled 2021-11-28: qty 2

## 2021-11-28 MED ORDER — ONDANSETRON HCL 4 MG/2ML IJ SOLN: 4.0000 mg | INTRAMUSCULAR | Status: DC | PRN

## 2021-11-28 MED ORDER — FLEET ENEMA 7-19 GM/118ML RE ENEM: 1.0000 | ENEMA | Freq: Every day | RECTAL | Status: DC | PRN

## 2021-11-28 MED ORDER — COCONUT OIL OIL: 1.0000 | TOPICAL_OIL | Status: DC | PRN

## 2021-11-28 MED ORDER — MEDROXYPROGESTERONE ACETATE 150 MG/ML IM SUSP: 150.0000 mg | INTRAMUSCULAR | Status: DC | PRN

## 2021-11-28 MED ORDER — LIDOCAINE HCL (PF) 1 % IJ SOLN: 30.0000 mL | INTRAMUSCULAR | Status: DC | PRN

## 2021-11-28 MED ORDER — MEASLES, MUMPS & RUBELLA VAC IJ SOLR: 0.5000 mL | Freq: Once | INTRAMUSCULAR | Status: DC

## 2021-11-28 MED ORDER — PRENATAL MULTIVITAMIN CH
1.0000 | ORAL_TABLET | Freq: Every day | ORAL | Status: DC
  Administered 2021-11-28 – 2021-11-30 (×3): 1 via ORAL
  Filled 2021-11-28 (×3): qty 1

## 2021-11-28 MED ORDER — ACETAMINOPHEN 325 MG PO TABS: 650.0000 mg | ORAL_TABLET | ORAL | Status: DC | PRN

## 2021-11-28 MED ORDER — METHYLERGONOVINE MALEATE 0.2 MG PO TABS: 0.2000 mg | ORAL_TABLET | ORAL | Status: DC | PRN

## 2021-11-28 MED ORDER — SOD CITRATE-CITRIC ACID 500-334 MG/5ML PO SOLN: 30.0000 mL | ORAL | Status: DC | PRN

## 2021-11-28 MED ORDER — OXYTOCIN BOLUS FROM INFUSION
333.0000 mL | Freq: Once | INTRAVENOUS | Status: DC
Start: 2021-11-28 — End: 2021-11-29

## 2021-11-28 MED ORDER — WITCH HAZEL-GLYCERIN EX PADS: 1.0000 | MEDICATED_PAD | CUTANEOUS | Status: DC | PRN

## 2021-11-28 MED ORDER — MAGNESIUM SULFATE 40 GM/1000ML IV SOLN
2.0000 g/h | INTRAVENOUS | Status: AC
  Administered 2021-11-28 (×2): 2 g/h via INTRAVENOUS
  Filled 2021-11-28 (×3): qty 1000

## 2021-11-28 MED ORDER — OXYTOCIN-SODIUM CHLORIDE 30-0.9 UT/500ML-% IV SOLN: 2.5000 [IU]/h | INTRAVENOUS | Status: DC

## 2021-11-28 MED ORDER — FUROSEMIDE 20 MG PO TABS
20.0000 mg | ORAL_TABLET | Freq: Every day | ORAL | Status: DC
Start: 2021-11-29 — End: 2021-11-30
  Administered 2021-11-29 – 2021-11-30 (×2): 20 mg via ORAL
  Filled 2021-11-28 (×2): qty 1

## 2021-11-28 MED ORDER — OXYTOCIN BOLUS FROM INFUSION: 333.0000 mL | Freq: Once | INTRAVENOUS | Status: DC

## 2021-11-28 MED ORDER — LABETALOL HCL 5 MG/ML IV SOLN: 20.0000 mg | INTRAVENOUS | Status: DC | PRN

## 2021-11-28 MED ORDER — FLEET ENEMA 7-19 GM/118ML RE ENEM: 1.0000 | ENEMA | RECTAL | Status: DC | PRN

## 2021-11-28 MED ORDER — HYDROXYZINE HCL 50 MG PO TABS: 50.0000 mg | ORAL_TABLET | Freq: Four times a day (QID) | ORAL | Status: DC | PRN

## 2021-11-28 MED ORDER — LABETALOL HCL 5 MG/ML IV SOLN: 40.0000 mg | INTRAVENOUS | Status: DC | PRN

## 2021-11-28 MED ORDER — ONDANSETRON HCL 4 MG PO TABS: 4.0000 mg | ORAL_TABLET | ORAL | Status: DC | PRN

## 2021-11-28 MED ORDER — LIDOCAINE HCL (PF) 1 % IJ SOLN
30.0000 mL | INTRAMUSCULAR | Status: DC | PRN
  Filled 2021-11-28: qty 30

## 2021-11-28 MED ORDER — DIBUCAINE (PERIANAL) 1 % EX OINT: 1.0000 | TOPICAL_OINTMENT | CUTANEOUS | Status: DC | PRN

## 2021-11-28 NOTE — MAU Note (Signed)
Tammie Webster is a 36 y.o. at [redacted]w[redacted]d here in MAU reporting: Pt reports ctx's that started at around 2200.  Reports vaginal bleeding like a period.  Pt reports gush of fluid at around 2245 Reports +FM  Onset of complaint: Today  Pain score: 10/10 There were no vitals filed for this visit.    Lab orders placed from triage:  none

## 2021-11-28 NOTE — H&P (Signed)
Tammie Webster is a 36 y.o. female G2P1001 with IUP at [redacted]w[redacted]d by LMP/US presenting for ctx that started around 2200, gush of fluid around 2245, and vaginal bleeding "like a period".  .  She reports positive fetal movement.   Prenatal History/Complications: PNC at HD Pregnancy complications: none - Past Medical History: Past Medical History:  Diagnosis Date   Chlamydia    Trichomonas infection     Past Surgical History: Past Surgical History:  Procedure Laterality Date   NO PAST SURGERIES      Obstetrical History: OB History     Gravida  2   Para  1   Term  1   Preterm      AB      Living  1      SAB      IAB      Ectopic      Multiple      Live Births  1            Social History: Social History   Socioeconomic History   Marital status: Single    Spouse name: Not on file   Number of children: Not on file   Years of education: Not on file   Highest education level: Not on file  Occupational History   Not on file  Tobacco Use   Smoking status: Former    Packs/day: 1.00    Types: Cigarettes   Smokeless tobacco: Never  Vaping Use   Vaping Use: Never used  Substance and Sexual Activity   Alcohol use: No   Drug use: No   Sexual activity: Not Currently    Birth control/protection: Injection  Other Topics Concern   Not on file  Social History Narrative   Not on file   Social Determinants of Health   Financial Resource Strain: Not on file  Food Insecurity: Not on file  Transportation Needs: Not on file  Physical Activity: Not on file  Stress: Not on file  Social Connections: Not on file    Family History: Family History  Problem Relation Age of Onset   Sickle cell anemia Mother    Hypertension Mother    Hypertension Father     Allergies: Allergies  Allergen Reactions   Shellfish Allergy Anaphylaxis    Medications Prior to Admission  Medication Sig Dispense Refill Last Dose   cyclobenzaprine (FLEXERIL) 10 MG tablet Take  1 tablet (10 mg total) by mouth 2 (two) times daily as needed for muscle spasms. 10 tablet 0 Past Week   meclizine (ANTIVERT) 25 MG tablet Take 1 tablet (25 mg total) by mouth 3 (three) times daily as needed for dizziness. 30 tablet 0 Past Week   Prenatal Vit-Fe Fumarate-FA (PRENATAL MULTIVITAMIN) TABS tablet Take 1 tablet by mouth daily at 12 noon.   11/27/2021    Review of Systems   Constitutional: Negative for fever and chills Eyes: Negative for visual disturbances Respiratory: Negative for shortness of breath, dyspnea Cardiovascular: Negative for chest pain or palpitations  Gastrointestinal: Negative for vomiting, diarrhea and constipation.  POSITIVE for abdominal pain (contractions) Genitourinary: Negative for dysuria and urgency Musculoskeletal: Negative for back pain, joint pain, myalgias  Neurological: Negative for dizziness and headaches  Blood pressure (!) 169/91, pulse 81, height 5\' 3"  (1.6 m), weight 74.8 kg, last menstrual period 02/27/2021. General appearance: alert, cooperative, and mild distress Lungs: normal respiratory effort Heart: regular rate and rhythm Abdomen: soft, non-tender; bowel sounds normal Extremities: Homans sign is negative, no sign  of DVT DTR's 2+ Presentation: cephalic Fetal monitoring  Baseline: 130 bpm, Variability: Good {> 6 bpm), Accelerations: Reactive, and Decelerations: Variable: moderate Uterine activity  q 2 minutes Dilation: 10 Effacement (%): 80 Station: -2 Exam by:: Verne Grain RN   Prenatal labs: ABO, Rh: --/--/PENDING (07/11 0044)B+ Antibody: PENDING (07/11 0044)neg Rubella:  immune RPR:   neg HBsAg:   neg HIV:   neg GBS:   neg 1 hr Glucola  79/136/121/108 Genetic screening  NIPS LR; AFP neg Anatomy US normal  Prenatal Transfer Tool  Maternal Diabetes: No Genetic Screening: Normal Maternal Ultrasounds/Referrals: Normal Fetal Ultrasounds or other Referrals:  None Maternal Substance Abuse:  No Significant Maternal  Medications:  None Significant Maternal Lab Results: Group B Strep negative  Results for orders placed or performed during the hospital encounter of 11/28/21 (from the past 24 hour(s))  CBC   Collection Time: 11/28/21 12:44 AM  Result Value Ref Range   WBC 8.1 4.0 - 10.5 K/uL   RBC 3.92 3.87 - 5.11 MIL/uL   Hemoglobin 10.2 (L) 12.0 - 15.0 g/dL   HCT 17.7 (L) 93.9 - 03.0 %   MCV 82.7 80.0 - 100.0 fL   MCH 26.0 26.0 - 34.0 pg   MCHC 31.5 30.0 - 36.0 g/dL   RDW 09.2 33.0 - 07.6 %   Platelets 312 150 - 400 K/uL   nRBC 0.4 (H) 0.0 - 0.2 %  Type and screen [facility]   Collection Time: 11/28/21 12:44 AM  Result Value Ref Range   ABO/RH(D) PENDING    Antibody Screen PENDING    Sample Expiration      12/01/2021,2359 Performed at [facility], [address] [place], [place] [address]     Assessment: Tammie Webster is a 36 y.o. G2P1001 with an IUP at [redacted]w[redacted]d presenting for labor, bloody show  Plan: #Labor:AROM w/clear fluid D/T decels.  Delivery imminent  #Pain: too late #FWB Cat 2 #ID: GBS: neg  #MOF:  breast #MOC: declines #Circ: yes   Jacklyn Shell 11/28/2021, 1:44 AM

## 2021-11-28 NOTE — Discharge Summary (Signed)
Postpartum Discharge Summary  Date of Service updated-7/13     Patient Name: Tammie Webster DOB: 05-Sep-1985 MRN: [id number]  Date of admission: 11/28/2021 Delivery date:11/28/2021  Delivering provider: Christin Fudge  Date of discharge: 11/30/2021  Admitting diagnosis: Indication for care in labor or delivery [O75.9] Intrauterine pregnancy: [redacted]w[redacted]d    Secondary diagnosis:  Principal Problem:   Indication for care in labor or delivery Active Problems:   Preeclampsia in postpartum period  Additional problems: precipitous labor    Discharge diagnosis: Term Pregnancy Delivered and Preeclampsia (severe)                                              Post partum procedures: MgSO4 Augmentation: AROM Complications: None  Hospital course: Onset of Labor With Vaginal Delivery      36y.o. yo [id number] [id number] admitted in Active Labor on 11/28/2021. Patient had an uncomplicated labor course as follows:  Membrane Rupture Time/Date: 12:57 AM ,11/28/2021   Delivery Method:Vaginal, Spontaneous  Episiotomy: None  Lacerations:  2nd degree  Pt diagnosed with preeclampsia with severe features based on elevated BP during labor.  Following delivery, she was treated with Mag x 24hrs and started on Lasix and Procardia.  Her BP significantly improved.  She is ambulating, tolerating a regular diet, passing flatus, and urinating well.  Patient is discharged home in stable condition on 11/30/21.  Newborn Data: Birth date:11/28/2021  Birth time:1:02 AM  Gender:Female  Living status:Living  Apgars:9 ,9  Weight:3130 g   Magnesium Sulfate received: Yes: Seizure prophylaxis BMZ received: No Rhophylac:N/A MMR:N/A T-DaP:Given prenatally Flu: N/A Transfusion:No  Physical exam  Vitals:   11/29/21 1130 11/29/21 1506 11/29/21 2119 11/30/21 0401  BP: 117/80 112/72 121/75 (!) 105/58  Pulse: 86 78 78 72  Resp: 18 16 19 18   Temp: 98.3 F (36.8 C) 98.4 F (36.9 C)  97.7 F (36.5 C)  TempSrc:  Oral Oral  Oral  SpO2: 100% 100% 100% 100%  Weight:      Height:       General: alert, cooperative, and no distress Lochia: appropriate Uterine Fundus: firm Incision: N/A DVT Evaluation: No evidence of DVT seen on physical exam. Labs: Lab Results  Component Value Date   WBC 8.1 11/28/2021   HGB 10.2 (L) 11/28/2021   HCT 32.4 (L) 11/28/2021   MCV 82.7 11/28/2021   PLT 312 11/28/2021      Latest Ref Rng & Units 11/28/2021   12:44 AM  CMP  Glucose 70 - 99 mg/dL 96   BUN 6 - 20 mg/dL 9   Creatinine 0.44 - 1.00 mg/dL 0.91   Sodium 135 - 145 mmol/L 137   Potassium 3.5 - 5.1 mmol/L 3.3   Chloride 98 - 111 mmol/L 106   CO2 22 - 32 mmol/L 18   Calcium 8.9 - 10.3 mg/dL 9.1   Total Protein 6.5 - 8.1 g/dL 6.9   Total Bilirubin 0.3 - 1.2 mg/dL 0.4   Alkaline Phos 38 - 126 U/L 227   AST 15 - 41 U/L 22   ALT 0 - 44 U/L 14    Edinburgh Score:    11/29/2021    8:00 PM  Edinburgh Postnatal Depression Scale Screening Tool  I have been able to laugh and see the funny side of things. 0  I have looked forward with enjoyment  to things. 0  I have blamed myself unnecessarily when things went wrong. 0  I have been anxious or worried for no good reason. 0  I have felt scared or panicky for no good reason. 0  Things have been getting on top of me. 1  I have been so unhappy that I have had difficulty sleeping. 0  I have felt sad or miserable. 0  I have been so unhappy that I have been crying. 0  The thought of harming myself has occurred to me. 0  Edinburgh Postnatal Depression Scale Total 1     After visit meds:  Allergies as of 11/30/2021       Reactions   Shellfish Allergy Anaphylaxis        Medication List     TAKE these medications    acetaminophen 325 MG tablet Commonly known as: Tylenol Take 2 tablets (650 mg total) by mouth every 6 (six) hours as needed.   cyclobenzaprine 10 MG tablet Commonly known as: FLEXERIL Take 1 tablet (10 mg total) by mouth 2 (two) times  daily as needed for muscle spasms.   ibuprofen 600 MG tablet Commonly known as: ADVIL Take 1 tablet (600 mg total) by mouth every 6 (six) hours as needed.   meclizine 25 MG tablet Commonly known as: ANTIVERT Take 1 tablet (25 mg total) by mouth 3 (three) times daily as needed for dizziness.   NIFEdipine 30 MG 24 hr tablet Commonly known as: ADALAT CC Take 1 tablet (30 mg total) by mouth daily.   prenatal multivitamin Tabs tablet Take 1 tablet by mouth daily at 12 noon.         Discharge home in stable condition Infant Feeding: Breast Infant Disposition:home with mother Discharge instruction: per After Visit Summary and Postpartum booklet. Activity: Advance as tolerated. Pelvic rest for 6 weeks.  Diet: routine diet Future Appointments:Plan for a one week BP check Follow up Visit:  Follow-up Information     Department, [facility] Follow up.   Contact information: [place] [address] [phone]         [facility] Department .   Contact information: [place], [place] [phone]                    11/30/2021 Annalee Genta, DO

## 2021-11-28 NOTE — Lactation Note (Signed)
This note was copied from a baby's chart. Lactation Consultation Note  Patient Name: Tammie Webster [id number] Date: 11/28/2021 Reason for consult: Initial assessment;1st time breastfeeding;Term;Breastfeeding assistance Age:35 hours  P2, Term, Infant Female  Baby at 28 hours old. LC entered the room and baby was asleep in the bassinet. Per mom, baby has been feeding well. She states that she has no pain with latching.   Mom has been set up with a DEBP. Mom states that she does not know why. Mom is currently on Mag. LC let mom know that being on Mag can delay milk production and the pump can help with establishing supply.   Mom was encouraged to put baby to the breast first, then pump, and feed any expressed milk to baby.   Mom says that she has been taught how to hand express. LC gave mom spoons and encouraged her to hand express and feed the expressed milk to baby as well.   LC spoke with mom about supply and demand, infant behavior within the first 2 days, infant I/O, and infant stomach size.  LC also reviewed the [facility] services brochure with mom.   Mom states that she does not have a pump at home and does not have an appointment with WIC. LC sent a [facility] referral.   Mom says that she has no further questions or concerns.   Current Feeding Plan:  Breastfeed 8+ times in 24 hours according to feeding cues.  Offer both breasts at each feeding.  Pump after feedings and feed expressed milk to baby. Call RN/LC for breastfeeding assistance.   Maternal Data Has patient been taught Hand Expression?: Yes Does the patient have breastfeeding experience prior to this delivery?: No  Feeding    LATCH Score                    Lactation Tools Discussed/Used    Interventions Interventions: Breast feeding basics reviewed;Education;LC Services brochure  Discharge Pump:  (No pump at home) [facility] Program: Yes  Consult Status Consult Status: Follow-up Date: 11/29/21 Follow-up type:  In-patient    Delene Loll 11/28/2021, 12:48 PM

## 2021-11-28 NOTE — Lactation Note (Signed)
This note was copied from a baby's chart. Lactation Consultation Note Mom all ready had baby latched on the breast when LC came into rm. RN assisted in latching. Praised mom. Mom denies painful latch.. Mom has a 35 yr old that she didn't BF. Mom didn't have any questions at this time. Mom stated she is going to BF but will probably do both. Mom will be f/u today by Lactation.  Patient Name: Tammie Webster [id number] Date: 11/28/2021 Reason for consult: L&D Initial assessment;Term Age:85 hours  Maternal Data    Feeding Mother's Current Feeding Choice: Breast Milk and Formula  LATCH Score Latch: Grasps breast easily, tongue down, lips flanged, rhythmical sucking.  Audible Swallowing: None  Type of Nipple: Everted at rest and after stimulation  Comfort (Breast/Nipple): Soft / non-tender  Hold (Positioning): Assistance needed to correctly position infant at breast and maintain latch.  LATCH Score: 7   Lactation Tools Discussed/Used    Interventions    Discharge    Consult Status Consult Status: Follow-up from L&D Date: 11/28/21 Follow-up type: In-patient    Micheale Schlack, Diamond Nickel 11/28/2021, 2:02 AM

## 2021-11-29 LAB — PROTEIN / CREATININE RATIO, URINE
Creatinine, Urine: 24 mg/dL
Protein Creatinine Ratio: 0.46 mg/mg{Cre} — ABNORMAL HIGH (ref 0.00–0.15)
Total Protein, Urine: 11 mg/dL

## 2021-11-29 LAB — TYPE AND SCREEN
ABO/RH(D): B POS
Antibody Screen: NEGATIVE

## 2021-11-29 NOTE — Progress Notes (Addendum)
Post Partum Day 1 Subjective: No complaints, up ad lib, voiding, tolerating PO, and + flatus.  Patient denies any headaches, visual symptoms, RUQ/epigastric pain or other concerning symptoms. Baby is breastfeeding.  Moderate lochia.   Objective: Blood pressure 119/70, pulse 79, temperature 98 F (36.7 C), temperature source Oral, resp. rate 16, height 5\' 3"  (1.6 m), weight 74.8 kg, last menstrual period 02/27/2021, SpO2 100 %, unknown if currently breastfeeding.  Physical Exam:  General: alert and no distress Lochia: appropriate Uterine Fundus: firm DVT Evaluation: No evidence of DVT seen on physical exam. Negative Homan's sign. No cords or calf tenderness. No significant calf/ankle edema.  Recent Labs    11/28/21 0044  HGB 10.2*  HCT 32.4*    Assessment/Plan: Continue Procardia , Lasix as prescribed for BP control Breastfeeding, declines postpartum contraception Plan for discharge tomorrow if remains stable Patient desires circumcision for her female infant.  Circumcision procedure details discussed, risks and benefits of procedure were also discussed.  These include but are not limited to: Benefits of circumcision in men include reduction in the rates of urinary tract infection (UTI), penile cancer, some sexually transmitted infections, penile inflammatory and retractile disorders, as well as easier hygiene.  Risks include bleeding , infection, injury of glans which may lead to penile deformity or urinary tract issues, unsatisfactory cosmetic appearance and other potential complications related to the procedure.  It was emphasized that this is an elective procedure.  Patient wants to proceed with circumcision; written informed consent obtained.  Circumcision will be done today or tomorrow, routine circumcision and post circumcision orders placed for the infant.    LOS: 1 day   01/29/22, MD 11/29/2021, 7:08 AM

## 2021-11-29 NOTE — Lactation Note (Signed)
This note was copied from a baby's chart. Lactation Consultation Note  Patient Name: Tammie Webster [id number] Date: 11/29/2021 Reason for consult: Follow-up assessment;Term;Nipple pain/trauma;Infant weight loss;Breastfeeding assistance (3.55% WL) Age:35 hours  P2, Term, Infant Female, 3.55% WL  LC entered the room and mom had just finished breastfeeding baby. Per mom, pumping has been causing her pain on level 4. LC encouraged mom to turn the pump down to a level in which she is comfortable. Mom pumped about 4mL and will feed the expressed milk to baby via a bottle.   Dad purchased coconut oil from the store. It was more of a body oil and contained mineral oil. He stated that he was going to the store later today to get coconut oil again. LC encouraged dad to get the type of coconut oil that you cook with or olive oil.   Dad asked why mom was not producing more milk. LC spoke with the family about milk production in the early days. The parents said that they were not aware that you started with drops and gradually increased to ounces.   Mom states that she has no further questions or concerns.   Mom will call RN/LC for breastfeeding assistance.   Interventions Interventions: Education;Breast feeding basics reviewed  Discharge    Consult Status Consult Status: Follow-up Date: 11/30/21 Follow-up type: In-patient    Tammie Webster 11/29/2021, 4:08 PM

## 2021-11-29 NOTE — Clinical Social Work Maternal (Signed)
CLINICAL SOCIAL WORK MATERNAL/CHILD NOTE  Patient Details  Name: Tammie Webster MRN: [id number] Date of Birth: 1985/11/23  Date:  11/29/2021  Clinical Social Worker Initiating Note:  Abundio Miu, Riddle Date/Time: Initiated:  11/29/21/0947     Child's Name:  Marilu Favre Mock   Biological Parents:  Mother, Father (FOBTera Helper 02/15/82)   Need for Interpreter:  None   Reason for Referral:  Current Substance Use/Substance Use During Pregnancy     Address:  [address] [place] [address]    Phone number:  [phone] (home)     Additional phone number:   Household Members/Support Persons (HM/SP):   Household Member/Support Person 1   HM/SP Name Relationship DOB or Age  HM/SP -1 Hal Hope Tout son 03/26/08  HM/SP -2        HM/SP -3        HM/SP -4        HM/SP -5        HM/SP -6        HM/SP -7        HM/SP -8          Natural Supports (not living in the home):  Parent   Professional Supports: None   Employment: Full-time   Type of Work: Management consultant   Education:  9 to 11 years (11th Grade)   Homebound arranged: No  Financial Resources:  [employer]   Other Resources:  [employer], Physicist, medical     Cultural/Religious Considerations Which May Impact Care:    Strengths:  Ability to meet basic needs  , Lexicographer chosen, Home prepared for child     Psychotropic Medications:         Pediatrician:    [facility]  Pediatrician List:   [facility]      Pediatrician Fax Number:    Risk Factors/Current Problems:  Substance Use     Cognitive State:  Able to Concentrate  , Alert  , Goal Oriented  , Linear Thinking     Mood/Affect:  Calm  , Interested  , Comfortable  , Happy  , Relaxed     CSW Assessment: CSW met with MOB at bedside to complete psychosocial assessment, MOB's son asleep on the couch. MOB was  sitting up in bed having breakfast and infant was asleep in basinet. MOB granted CSW verbal permission to speak in front of her son about anything. CSW introduced self and explained reason for consult. MOB was welcoming, pleasant, and remained engaged during assessment. 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 MOB reported that she receives both [facility] and food stamps. MOB reported that they have all items needed to care for infant except for a breast pump. MOB verbalized plan to follow up with her insurance company regarding a breast pump. MOB reported that she has a car seat and crib. CSW inquired about MOB's support system, MOB reported that her mom is a support.   Female guest entered the room, MOB granted CSW verbal permission to continue assessment and speak about anything in front of guest.   CSW inquired about MOB's mental health history. MOB denied any mental health history. MOB denied any history of postpartum depression. CSW inquired about how MOB was feeling emotionally since giving birth, MOB reported  that she was feeling good. MOB presented calm and happy as evidenced by smiling and laughing at appropriate times throughout the assessment. MOB did not demonstrate any acute mental health signs/symptoms. CSW assessed for safety, MOB denied SI, HI, and domestic violence.   CSW provided education regarding the baby blues period vs. perinatal mood disorders, discussed treatment and gave resources for mental health follow up if concerns arise.  CSW recommends self-evaluation during the postpartum time period using the New Mom Checklist from Postpartum Progress and encouraged MOB to contact a medical professional if symptoms are noted at any time.    CSW provided review of Sudden Infant Death Syndrome (SIDS) precautions.    CSW informed MOB about the hospital drug screen policy due to documented substance use during pregnancy. MOB  confirmed marijuana use for appetite and reported last use as May. MOB denied any additional substance use during pregnancy. CSW informed MOB that infant's UDS was negative and CDS would continue to be monitored and a CPS report would be made if warranted. MOB verbalized understanding and denied any CPS history.   CSW identifies no further need for intervention and no barriers to discharge at this time.   CSW Plan/Description:  No Further Intervention Required/No Barriers to Discharge, Sudden Infant Death Syndrome (SIDS) Education, Perinatal Mood and Anxiety Disorder (PMADs) Education, [place], CSW Will Continue to Monitor Umbilical Cord Tissue Drug Screen Results and Make Report if Barbette Or, LCSW 11/29/2021, 9:51 AM

## 2021-11-29 NOTE — Lactation Note (Signed)
This note was copied from a baby's chart. Lactation Consultation Note  Patient Name: Tammie Webster [id number] Date: 11/29/2021 Reason for consult: Follow-up assessment;1st time breastfeeding;Infant weight loss;Breastfeeding assistance;Nipple pain/trauma (3.55% WL) Age:35 hours  P2, Term, Infant Female, 3.55% WL  Infant is at 35 hours. LC entered the room and baby was asleep in the bassinet. Per mom, breastfeeding is going well. Mom states that baby was circumcised this morning. LC let mom know that baby may be sleepy and encouraged her to pump for stimulation if baby is not waking up to feed within a few hours.   Mom states that her nipples are sore and she is sad that we don't have coconut oil. LC encouraged mom to hand express and let her milk air dry on her nipples for healing.   LC asked mom if the pain she felt was just during the initial latch or if it lasted throughout the feeding. Mom says that the pain is only when baby first gets on the breast. LC let mom know that having pain with the initial latch may be normal, but encouraged her to call for latch assistance when baby is ready to feed.   FOB asked what kind of formula they should use when they leave. LC let the parents know that it would be up to them to decide which formula to use.   LC let mom know that the [facility] referral had been sent for her. She stated that [facility] called her today and encouraged her to reach out to her insurance company about getting a pump.   Mom states that she will probably do some pumping when she gets home.   Mom states that she has no further questions or concerns.   Maternal Data    Feeding    LATCH Score                    Lactation Tools Discussed/Used    Interventions Interventions: Breast feeding basics reviewed;Education  Discharge    Consult Status Consult Status: Follow-up Date: 11/30/21 Follow-up type: In-patient    Orvil Feil Macaria Bias 11/29/2021, 12:08 PM

## 2021-11-30 ENCOUNTER — Other Ambulatory Visit (HOSPITAL_COMMUNITY): Payer: Self-pay

## 2021-11-30 MED ORDER — NIFEDIPINE ER 30 MG PO TB24
30.0000 mg | ORAL_TABLET | Freq: Every day | ORAL | 0 refills | Status: AC
  Filled 2021-11-30: qty 30, 30d supply, fill #0

## 2021-11-30 MED ORDER — IBUPROFEN 600 MG PO TABS
600.0000 mg | ORAL_TABLET | Freq: Four times a day (QID) | ORAL | 0 refills | Status: AC | PRN
  Filled 2021-11-30: qty 30, 8d supply, fill #0

## 2021-11-30 MED ORDER — ACETAMINOPHEN 325 MG PO TABS
650.0000 mg | ORAL_TABLET | Freq: Four times a day (QID) | ORAL | Status: AC | PRN
Start: 2021-11-30 — End: ?

## 2021-11-30 NOTE — Progress Notes (Signed)
Pt given discharge instructions and all questions answered. Pt verbalized understanding. Currently awaiting new prescriptions to be delivered by [facility].

## 2021-11-30 NOTE — Progress Notes (Signed)
Pt discharged in stable condition.

## 2021-11-30 NOTE — Lactation Note (Signed)
This note was copied from a baby's chart. Lactation Consultation Note  Patient Name: Tammie Webster [id number] Date: 11/30/2021 Reason for consult: Follow-up assessment Age:35 hours  P2, Baby latched upon entering. Feed on demand with cues.  Goal 8-12+ times per day after first 24 hrs.  Reviewed engorgement care and monitoring voids/stools.    Feeding Mother's Current Feeding Choice: Breast Milk  LATCH Score Latch: Grasps breast easily, tongue down, lips flanged, rhythmical sucking.  Audible Swallowing: A few with stimulation  Type of Nipple: Everted at rest and after stimulation  Comfort (Breast/Nipple): Soft / non-tender  Hold (Positioning): No assistance needed to correctly position infant at breast.  LATCH Score: 9   Interventions Interventions: Education  Discharge Discharge Education: Warning signs for feeding baby;Engorgement and breast care  Consult Status Consult Status: Complete Date: 11/30/21    Dahlia Byes [facility] 11/30/2021, 11:28 AM

## 2021-12-06 ENCOUNTER — Telehealth (HOSPITAL_COMMUNITY): Payer: Self-pay | Admitting: *Deleted

## 2021-12-06 NOTE — Telephone Encounter (Signed)
Patient voiced no questions or concerns regarding her health at this time. EPDS=6.  Patient reported that she recently switched infant from breastmilk to formula. Verbalized concerns regarding infant's stool. RN informed patient about what normal infant stool appearance should be and when to call pediatrician. Patient verbalized understanding. No problems reported. Patient voiced no other questions or concerns regarding infant at this time. Patient reports infant sleeps in a bassinet on his back. RN reviewed ABCs of safe sleep. Patient verbalized understanding. Patient requested RN email information on hospital's virtual Baby and Me class. Email sent. Deforest Hoyles, RN, 12/06/21, [phone]

## 2021-12-30 IMAGING — CR DG FOOT COMPLETE 3+V*R*
3 series · 3 of 3 positions shown · non-contrast
Comparison: Chest radiograph dated 06/17/2006.
COMPARISON: Foot radiograph dated 02/02/2019.
COMPARISON: Chest radiograph dated 06/17/2006.

Addendum:
CLINICAL DATA: 82-year-old female with shortness of breath.

EXAM:
PORTABLE CHEST 1 VIEW
CLINICAL DATA: 34-year-old female with fall and pain in the lateral
aspect of the foot.

[x foot ap right]
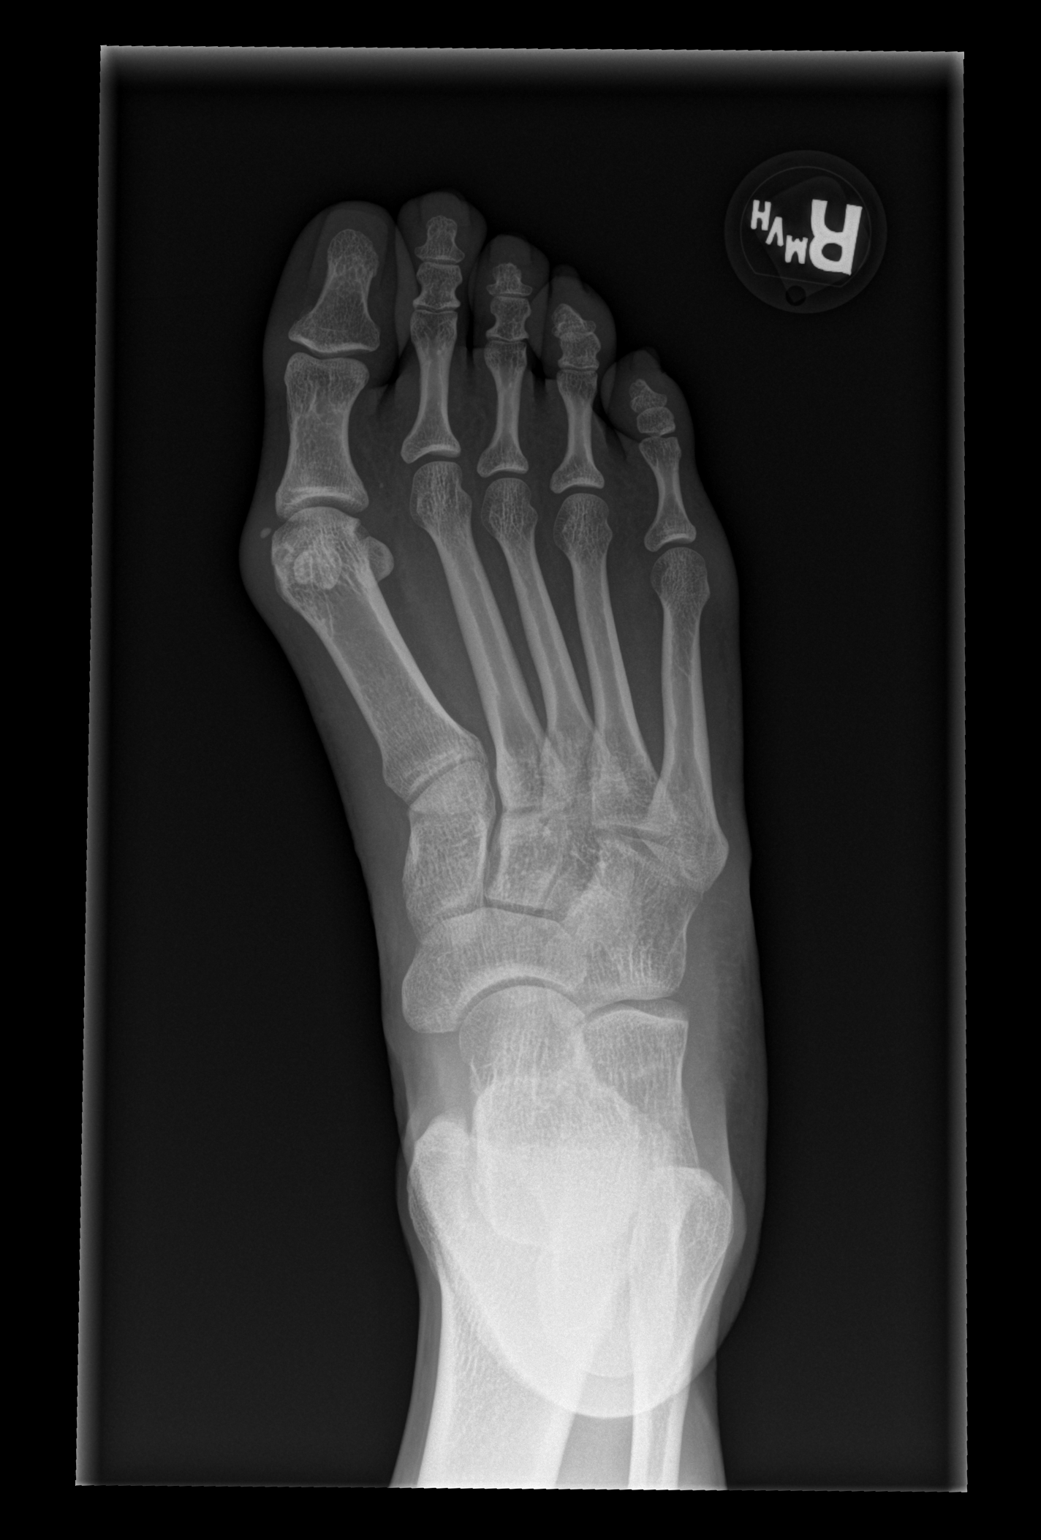

[x foot obl right]
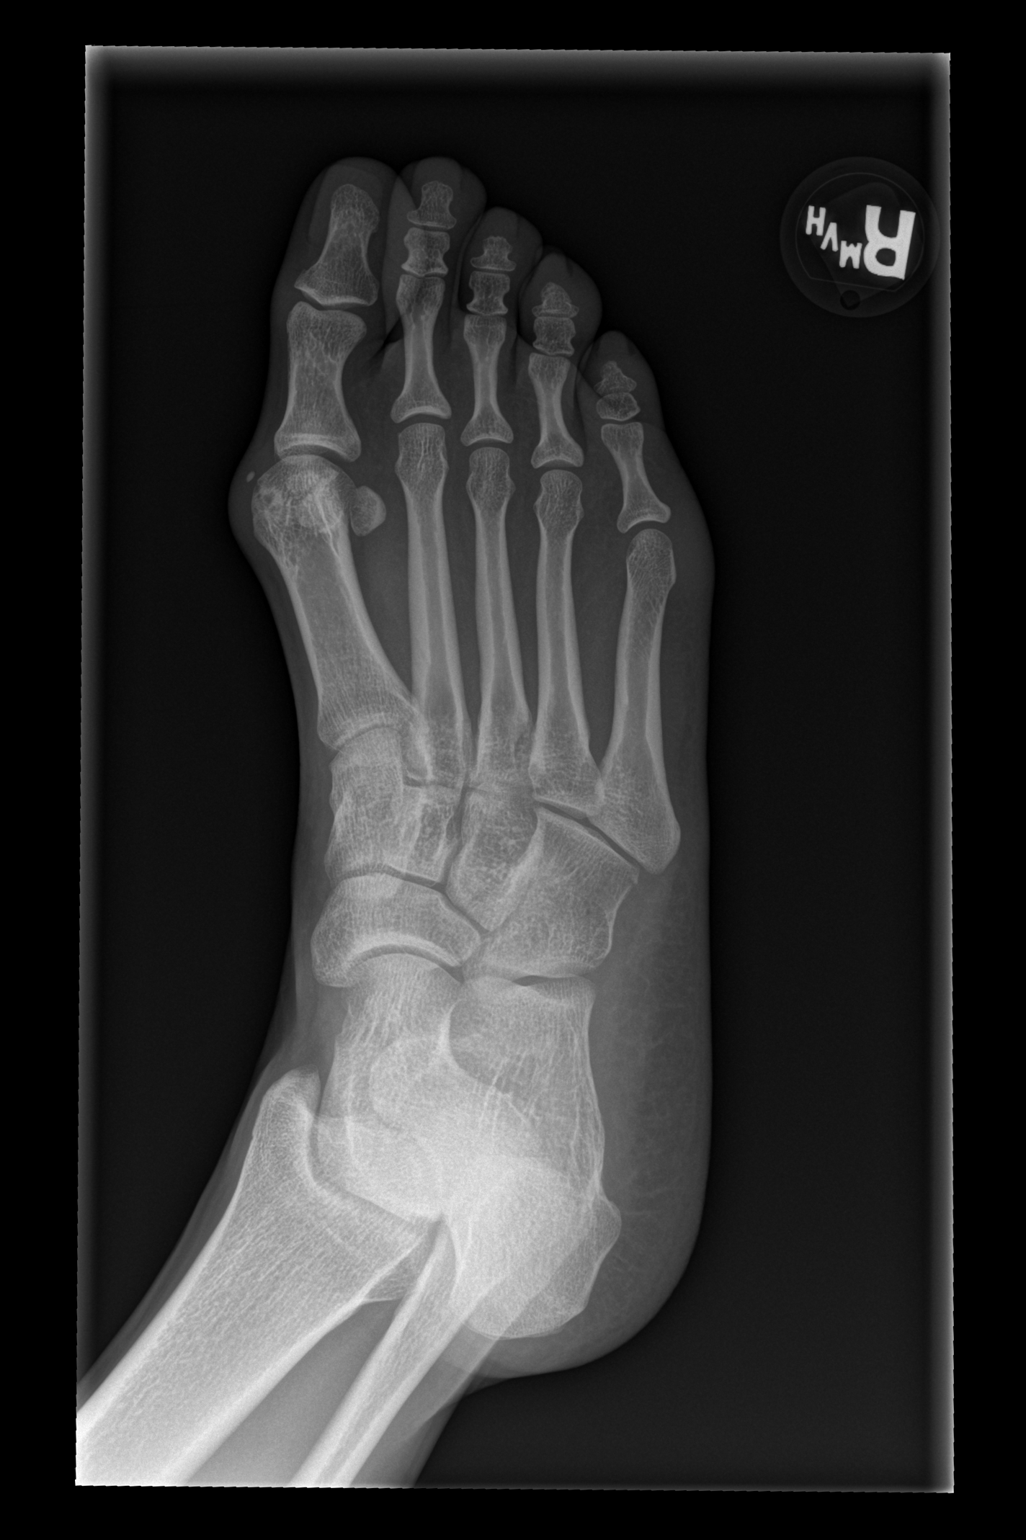

[x foot lat right]
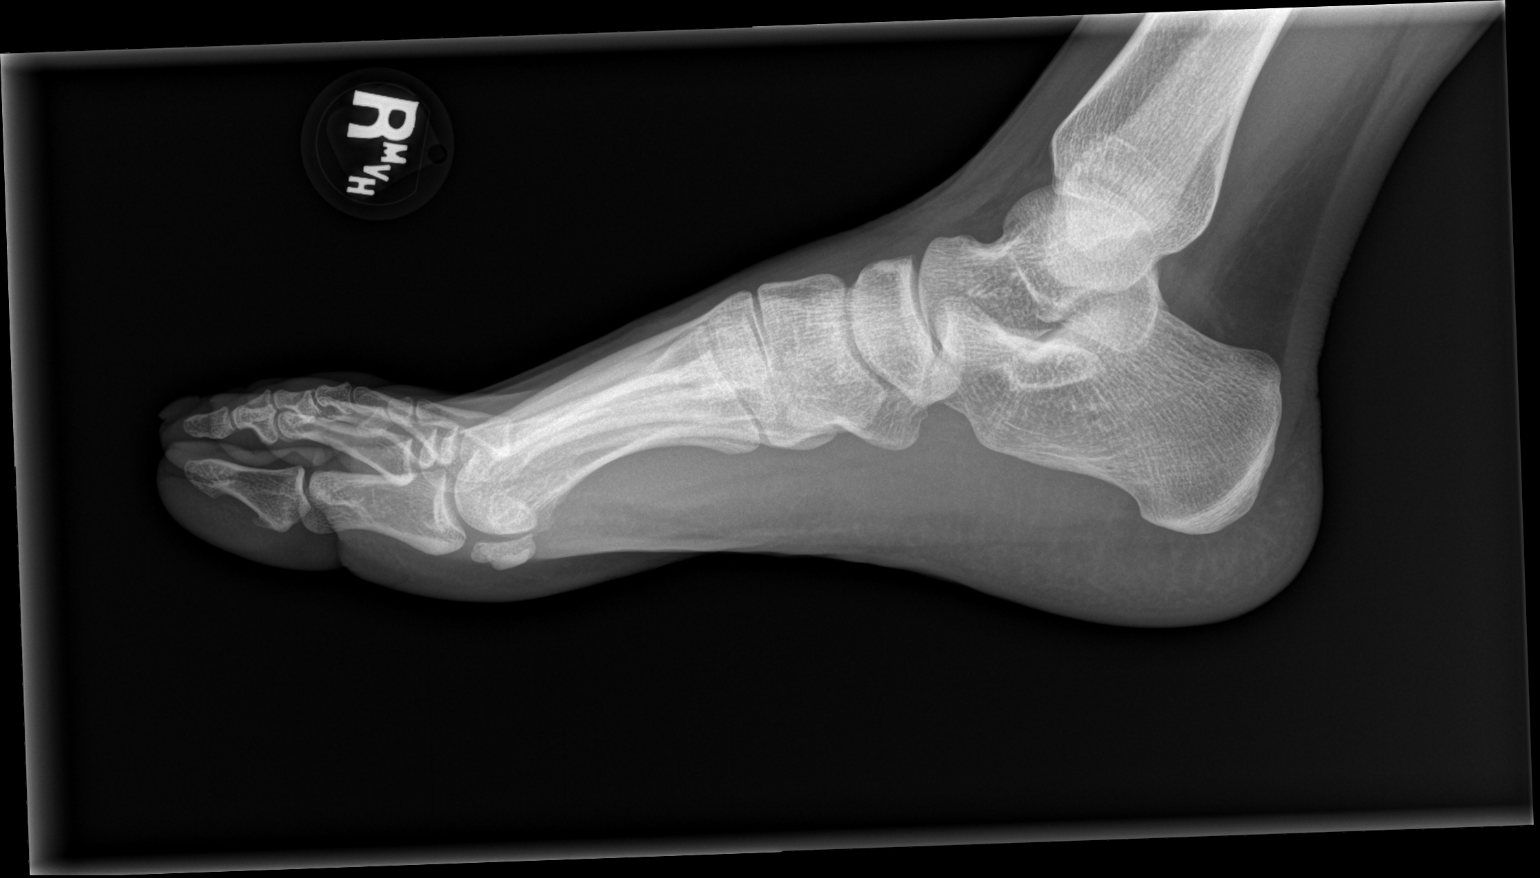

[3 of 3 positions shown; findings below may reference images not displayed]

FINDINGS: Shallow inspiration. Left lung base density as well as faint
bilateral upper lobe densities may represent atelectasis versus
pneumonia. Clinical correlation is recommended. A small left pleural
effusion present. No pneumothorax. Borderline cardiomegaly. No acute
osseous pathology. Degenerative changes of the shoulders.
IMPRESSION: Left lung base and bilateral upper lobe atelectasis versus
pneumonia.

ADDENDUM:
Please disregard the dictated report. This addendum will serve as
full report of the findings.
FINDINGS: There is no acute fracture or dislocation. The bones are well
mineralized. No arthritic changes. There is mild hallux valgus. Mild
soft tissue thickening over the medial first metatarsal head. The
soft tissues are otherwise unremarkable.
IMPRESSION: No acute fracture or dislocation.

*** End of Addendum ***
FINDINGS: Shallow inspiration. Left lung base density as well as faint
bilateral upper lobe densities may represent atelectasis versus
pneumonia. Clinical correlation is recommended. A small left pleural
effusion present. No pneumothorax. Borderline cardiomegaly. No acute
osseous pathology. Degenerative changes of the shoulders.
IMPRESSION: Left lung base and bilateral upper lobe atelectasis versus
pneumonia.

## 2022-01-20 ENCOUNTER — Encounter (HOSPITAL_COMMUNITY): Payer: Self-pay | Admitting: Emergency Medicine

## 2022-01-20 ENCOUNTER — Other Ambulatory Visit: Payer: Self-pay

## 2022-01-20 ENCOUNTER — Emergency Department (HOSPITAL_COMMUNITY)
Admission: EM | Admit: 2022-01-20 | Discharge: 2022-01-20 | Disposition: A | Discharge status: Home / Self Care | Payer: Medicaid Other | Attending: Emergency Medicine | Admitting: Emergency Medicine

## 2022-01-20 DIAGNOSIS — L0231 Cutaneous abscess of buttock: Secondary | ICD-10-CM | POA: Diagnosis present

## 2022-01-20 MED ORDER — DOXYCYCLINE HYCLATE 100 MG PO CAPS: 100.0000 mg | ORAL_CAPSULE | Freq: Two times a day (BID) | ORAL | 0 refills | Status: AC

## 2022-01-20 MED ORDER — LIDOCAINE HCL (PF) 1 % IJ SOLN
10.0000 mL | Freq: Once | INTRAMUSCULAR | Status: DC
  Filled 2022-01-20: qty 10

## 2022-01-20 NOTE — ED Triage Notes (Signed)
Pt endorses boil to buttocks x2 days. Reports hx of boils. Denies any fevers.

## 2022-01-20 NOTE — ED Provider Notes (Signed)
[facility] EMERGENCY DEPARTMENT Provider Note   CSN: [id number] Arrival date & time: 01/20/22  1237     History  Chief Complaint  Patient presents with   Recurrent Skin Infections    Tammie Webster is a 36 y.o. female.  Pt reports she has an abscess on her buttock.  Pt reports she has had to have drained in the past   The history is provided by the patient. No language interpreter was used.  Abscess Location:  Pelvis Pelvic abscess location:  R buttock Size:  2 Abscess quality: painful, redness and warmth   Progression:  Worsening Pain details:    Quality:  Aching   Severity:  Moderate   Progression:  Worsening Chronicity:  New Relieved by:  Nothing Worsened by:  Nothing Ineffective treatments:  None tried Associated symptoms: no fever and no vomiting        Home Medications Prior to Admission medications   Medication Sig Start Date End Date Taking? Authorizing Provider  doxycycline (VIBRAMYCIN) 100 MG capsule Take 1 capsule (100 mg total) by mouth 2 (two) times daily. 01/20/22  Yes Elson Areas, PA-C  acetaminophen (TYLENOL) 325 MG tablet Take 2 tablets (650 mg total) by mouth every 6 (six) hours as needed. 11/30/21   Myna Hidalgo, DO  cyclobenzaprine (FLEXERIL) 10 MG tablet Take 1 tablet (10 mg total) by mouth 2 (two) times daily as needed for muscle spasms. 08/12/20   Walisiewicz, Yvonna Alanis E, PA-C  ibuprofen (ADVIL) 600 MG tablet Take 1 tablet (600 mg total) by mouth every 6 (six) hours as needed. 11/30/21   Myna Hidalgo, DO  meclizine (ANTIVERT) 25 MG tablet Take 1 tablet (25 mg total) by mouth 3 (three) times daily as needed for dizziness. 07/24/19   Sabas Sous, MD  NIFEdipine (ADALAT CC) 30 MG 24 hr tablet Take 1 tablet (30 mg total) by mouth daily. 11/30/21 12/30/21  Myna Hidalgo, DO  Prenatal Vit-Fe Fumarate-FA (PRENATAL MULTIVITAMIN) TABS tablet Take 1 tablet by mouth daily at 12 noon.    [provider]      Allergies     Shellfish allergy    Review of Systems   Review of Systems  Constitutional:  Negative for fever.  Gastrointestinal:  Negative for vomiting.  All other systems reviewed and are negative.   Physical Exam Updated Vital Signs BP (!) 141/100 (BP Location: Right Arm)   Pulse (!) 122   Temp 98.8 F (37.1 C) (Oral)   Resp 16   Ht 5\' 2"  (1.575 m)   Wt 65.3 kg   LMP 02/27/2021   SpO2 100%   BMI 26.34 kg/m  Physical Exam Vitals and nursing note reviewed.  Constitutional:      Appearance: She is well-developed.  HENT:     Head: Normocephalic.  Pulmonary:     Effort: Pulmonary effort is normal.  Abdominal:     General: Abdomen is flat. There is no distension.  Musculoskeletal:        General: Normal range of motion.     Cervical back: Normal range of motion.  Skin:    General: Skin is warm.     Comments: 2cm abscess right buttock   Neurological:     General: No focal deficit present.     Mental Status: She is alert and oriented to person, place, and time.     ED Results / Procedures / Treatments   Labs (all labs ordered are listed, but only abnormal results are  displayed) Labs Reviewed - No data to display  EKG None  Radiology No results found.  Procedures .Marland KitchenIncision and Drainage  Date/Time: 01/20/2022 3:40 PM  Performed by: Elson Areas, PA-C Authorized by: Elson Areas, PA-C   Consent:    Consent obtained:  Verbal   Consent given by:  Patient   Risks, benefits, and alternatives were discussed: yes     Risks discussed:  Bleeding Universal protocol:    Procedure explained and questions answered to patient or proxy's satisfaction: yes     Immediately prior to procedure, a time out was called: yes     Patient identity confirmed:  Verbally with patient Location:    Type:  Abscess   Size:  2 Pre-procedure details:    Skin preparation:  Povidone-iodine Anesthesia:    Anesthesia method:  Local infiltration   Local anesthetic:  Bupivacaine 0.25% w/o  epi Procedure type:    Complexity:  Complex Procedure details:    Incision types:  Single straight   Wound management:  Probed and deloculated and irrigated with saline   Drainage amount:  Copious   Wound treatment:  Wound left open and drain placed   Packing materials:  1/4 in iodoform gauze Post-procedure details:    Procedure completion:  Tolerated     Medications Ordered in ED Medications  lidocaine (PF) (XYLOCAINE) 1 % injection 10 mL (has no administration in time range)    ED Course/ Medical Decision Making/ A&P                           Medical Decision Making Pt complains of an abscess to her right buttock   Risk Prescription drug management. Risk Details: I and D perfrmed.  Pt given rx for doxycycline.  Pt advised to remove packing in 2 days            Final Clinical Impression(s) / ED Diagnoses Final diagnoses:  Abscess of left buttock    Rx / DC Orders ED Discharge Orders          Ordered    doxycycline (VIBRAMYCIN) 100 MG capsule  2 times daily        01/20/22 1400          An After Visit Summary was printed and given to the patient.     Elson Areas, [place] 01/20/22 1542    Gloris Manchester, MD 01/20/22 [phone]

## 2022-01-20 NOTE — Discharge Instructions (Addendum)
Remove packing in 2 days.  Return if any problems
